# Patient Record
Sex: Male | Born: 1965
Health system: Southern US, Community
[De-identification: ages and names within clinical notes are randomized; demographics above are authoritative.]

## PROBLEM LIST (undated history)

## (undated) DIAGNOSIS — G8929 Other chronic pain: Secondary | ICD-10-CM

## (undated) DIAGNOSIS — F1120 Opioid dependence, uncomplicated: Secondary | ICD-10-CM

## (undated) DIAGNOSIS — Z720 Tobacco use: Secondary | ICD-10-CM

## (undated) DIAGNOSIS — M47816 Spondylosis without myelopathy or radiculopathy, lumbar region: Secondary | ICD-10-CM

## (undated) DIAGNOSIS — M549 Dorsalgia, unspecified: Secondary | ICD-10-CM

## (undated) DIAGNOSIS — F411 Generalized anxiety disorder: Secondary | ICD-10-CM

## (undated) HISTORY — DX: Opioid dependence, uncomplicated: F11.20

## (undated) HISTORY — DX: Spondylosis without myelopathy or radiculopathy, lumbar region: M47.816

## (undated) HISTORY — PX: BACK SURGERY: SHX140

## (undated) HISTORY — DX: Generalized anxiety disorder: F41.1

## (undated) HISTORY — DX: Tobacco use: Z72.0

---

## 2000-12-04 ENCOUNTER — Emergency Department (HOSPITAL_COMMUNITY): Admission: EM | Admit: 2000-12-04 | Discharge: 2000-12-04 | Payer: Self-pay | Admitting: Emergency Medicine

## 2000-12-04 ENCOUNTER — Encounter: Payer: Self-pay | Admitting: Emergency Medicine

## 2001-08-01 ENCOUNTER — Emergency Department (HOSPITAL_COMMUNITY): Admission: EM | Admit: 2001-08-01 | Discharge: 2001-08-01 | Payer: Self-pay

## 2002-03-09 ENCOUNTER — Emergency Department (HOSPITAL_COMMUNITY): Admission: EM | Admit: 2002-03-09 | Discharge: 2002-03-09 | Payer: Self-pay | Admitting: Emergency Medicine

## 2002-04-25 ENCOUNTER — Encounter: Payer: Self-pay | Admitting: Orthopedic Surgery

## 2002-04-25 ENCOUNTER — Encounter (INDEPENDENT_AMBULATORY_CARE_PROVIDER_SITE_OTHER): Payer: Self-pay | Admitting: Specialist

## 2002-04-25 ENCOUNTER — Observation Stay (HOSPITAL_COMMUNITY): Admission: RE | Admit: 2002-04-25 | Discharge: 2002-04-26 | Payer: Self-pay | Admitting: Orthopedic Surgery

## 2004-02-16 ENCOUNTER — Emergency Department (HOSPITAL_COMMUNITY): Admission: EM | Admit: 2004-02-16 | Discharge: 2004-02-16 | Payer: Self-pay | Admitting: *Deleted

## 2004-02-17 ENCOUNTER — Emergency Department (HOSPITAL_COMMUNITY): Admission: EM | Admit: 2004-02-17 | Discharge: 2004-02-17 | Payer: Self-pay | Admitting: Emergency Medicine

## 2004-03-06 ENCOUNTER — Ambulatory Visit (HOSPITAL_COMMUNITY): Admission: RE | Admit: 2004-03-06 | Discharge: 2004-03-06 | Payer: Self-pay | Admitting: Specialist

## 2004-06-21 ENCOUNTER — Emergency Department (HOSPITAL_COMMUNITY): Admission: EM | Admit: 2004-06-21 | Discharge: 2004-06-21 | Payer: Self-pay | Admitting: Family Medicine

## 2005-06-29 ENCOUNTER — Ambulatory Visit: Payer: Self-pay | Admitting: Internal Medicine

## 2005-07-13 ENCOUNTER — Emergency Department (HOSPITAL_COMMUNITY): Admission: EM | Admit: 2005-07-13 | Discharge: 2005-07-13 | Payer: Self-pay | Admitting: Emergency Medicine

## 2005-07-18 ENCOUNTER — Ambulatory Visit: Payer: Self-pay | Admitting: Internal Medicine

## 2005-12-27 ENCOUNTER — Ambulatory Visit: Payer: Self-pay | Admitting: Internal Medicine

## 2006-03-05 ENCOUNTER — Emergency Department (HOSPITAL_COMMUNITY): Admission: EM | Admit: 2006-03-05 | Discharge: 2006-03-05 | Payer: Self-pay | Admitting: Emergency Medicine

## 2006-03-08 ENCOUNTER — Ambulatory Visit: Payer: Self-pay | Admitting: Internal Medicine

## 2006-04-24 ENCOUNTER — Emergency Department (HOSPITAL_COMMUNITY): Admission: EM | Admit: 2006-04-24 | Discharge: 2006-04-24 | Payer: Self-pay | Admitting: Emergency Medicine

## 2006-07-25 DIAGNOSIS — M47816 Spondylosis without myelopathy or radiculopathy, lumbar region: Secondary | ICD-10-CM | POA: Insufficient documentation

## 2006-07-25 DIAGNOSIS — Z720 Tobacco use: Secondary | ICD-10-CM

## 2006-07-25 DIAGNOSIS — F411 Generalized anxiety disorder: Secondary | ICD-10-CM | POA: Insufficient documentation

## 2006-07-25 DIAGNOSIS — F329 Major depressive disorder, single episode, unspecified: Secondary | ICD-10-CM

## 2006-07-25 DIAGNOSIS — F1420 Cocaine dependence, uncomplicated: Secondary | ICD-10-CM | POA: Insufficient documentation

## 2006-07-25 DIAGNOSIS — M549 Dorsalgia, unspecified: Secondary | ICD-10-CM | POA: Insufficient documentation

## 2006-07-25 HISTORY — DX: Generalized anxiety disorder: F41.1

## 2006-07-25 HISTORY — DX: Spondylosis without myelopathy or radiculopathy, lumbar region: M47.816

## 2006-07-25 HISTORY — DX: Tobacco use: Z72.0

## 2006-08-25 DIAGNOSIS — M545 Low back pain: Secondary | ICD-10-CM

## 2006-11-11 ENCOUNTER — Ambulatory Visit (HOSPITAL_COMMUNITY): Admission: RE | Admit: 2006-11-11 | Discharge: 2006-11-11 | Payer: Self-pay | Admitting: Specialist

## 2006-12-01 ENCOUNTER — Encounter: Admission: RE | Admit: 2006-12-01 | Discharge: 2006-12-01 | Payer: Self-pay | Admitting: Specialist

## 2007-06-06 ENCOUNTER — Ambulatory Visit: Payer: Self-pay | Admitting: Internal Medicine

## 2007-06-06 LAB — CONVERTED CEMR LAB
ALT: 16 units/L (ref 0–53)
AST: 14 units/L (ref 0–37)
Albumin: 4.7 g/dL (ref 3.5–5.2)
Alkaline Phosphatase: 74 units/L (ref 39–117)
BUN: 12 mg/dL (ref 6–23)
Basophils Absolute: 0 10*3/uL (ref 0.0–0.1)
Basophils Relative: 0 % (ref 0–1)
CO2: 23 meq/L (ref 19–32)
Calcium: 9.8 mg/dL (ref 8.4–10.5)
Chloride: 106 meq/L (ref 96–112)
Creatinine, Ser: 0.95 mg/dL (ref 0.40–1.50)
Eosinophils Absolute: 0 10*3/uL (ref 0.0–0.7)
Eosinophils Relative: 0 % (ref 0–5)
Glucose, Bld: 91 mg/dL (ref 70–99)
HCT: 49.2 % (ref 39.0–52.0)
Hemoglobin: 16.7 g/dL (ref 13.0–17.0)
Lymphocytes Relative: 19 % (ref 12–46)
Lymphs Abs: 2.2 10*3/uL (ref 0.7–3.3)
MCHC: 33.9 g/dL (ref 30.0–36.0)
MCV: 90.3 fL (ref 78.0–100.0)
Monocytes Absolute: 0.5 10*3/uL (ref 0.2–0.7)
Monocytes Relative: 4 % (ref 3–11)
Neutro Abs: 8.9 10*3/uL — ABNORMAL HIGH (ref 1.7–7.7)
Neutrophils Relative %: 77 % (ref 43–77)
Platelets: 282 10*3/uL (ref 150–400)
Potassium: 4.5 meq/L (ref 3.5–5.3)
RBC: 5.45 M/uL (ref 4.22–5.81)
RDW: 13.5 % (ref 11.5–14.0)
Sed Rate: 11 mm/hr (ref 0–16)
Sodium: 140 meq/L (ref 135–145)
Total Bilirubin: 0.5 mg/dL (ref 0.3–1.2)
Total Protein: 8.1 g/dL (ref 6.0–8.3)
WBC: 11.6 10*3/uL — ABNORMAL HIGH (ref 4.0–10.5)

## 2007-08-27 ENCOUNTER — Emergency Department (HOSPITAL_COMMUNITY): Admission: EM | Admit: 2007-08-27 | Discharge: 2007-08-27 | Payer: Self-pay | Admitting: Emergency Medicine

## 2007-10-03 ENCOUNTER — Emergency Department (HOSPITAL_COMMUNITY): Admission: EM | Admit: 2007-10-03 | Discharge: 2007-10-03 | Payer: Self-pay | Admitting: Emergency Medicine

## 2008-01-04 ENCOUNTER — Emergency Department (HOSPITAL_COMMUNITY): Admission: EM | Admit: 2008-01-04 | Discharge: 2008-01-04 | Payer: Self-pay | Admitting: Emergency Medicine

## 2008-03-24 ENCOUNTER — Emergency Department (HOSPITAL_COMMUNITY): Admission: EM | Admit: 2008-03-24 | Discharge: 2008-03-24 | Payer: Self-pay | Admitting: Emergency Medicine

## 2008-05-04 ENCOUNTER — Emergency Department (HOSPITAL_COMMUNITY): Admission: EM | Admit: 2008-05-04 | Discharge: 2008-05-04 | Payer: Self-pay | Admitting: Emergency Medicine

## 2008-06-19 ENCOUNTER — Emergency Department (HOSPITAL_COMMUNITY): Admission: EM | Admit: 2008-06-19 | Discharge: 2008-06-19 | Payer: Self-pay | Admitting: Emergency Medicine

## 2008-08-04 ENCOUNTER — Emergency Department (HOSPITAL_COMMUNITY): Admission: EM | Admit: 2008-08-04 | Discharge: 2008-08-04 | Payer: Self-pay | Admitting: Emergency Medicine

## 2008-12-16 ENCOUNTER — Emergency Department (HOSPITAL_COMMUNITY): Admission: EM | Admit: 2008-12-16 | Discharge: 2008-12-16 | Payer: Self-pay | Admitting: Emergency Medicine

## 2009-03-23 ENCOUNTER — Emergency Department (HOSPITAL_COMMUNITY): Admission: EM | Admit: 2009-03-23 | Discharge: 2009-03-23 | Payer: Self-pay | Admitting: Emergency Medicine

## 2009-03-29 ENCOUNTER — Emergency Department (HOSPITAL_COMMUNITY): Admission: EM | Admit: 2009-03-29 | Discharge: 2009-03-29 | Payer: Self-pay | Admitting: Emergency Medicine

## 2009-03-30 ENCOUNTER — Emergency Department (HOSPITAL_COMMUNITY): Admission: EM | Admit: 2009-03-30 | Discharge: 2009-03-30 | Payer: Self-pay | Admitting: Family Medicine

## 2010-10-03 ENCOUNTER — Encounter: Payer: Self-pay | Admitting: Infectious Diseases

## 2010-12-04 ENCOUNTER — Emergency Department (HOSPITAL_COMMUNITY): Payer: Self-pay

## 2010-12-04 ENCOUNTER — Emergency Department (HOSPITAL_COMMUNITY)
Admission: EM | Admit: 2010-12-04 | Discharge: 2010-12-04 | Disposition: A | Discharge status: Home / Self Care | Payer: Self-pay | Attending: Emergency Medicine | Admitting: Emergency Medicine

## 2010-12-04 DIAGNOSIS — S5000XA Contusion of unspecified elbow, initial encounter: Secondary | ICD-10-CM | POA: Insufficient documentation

## 2010-12-04 DIAGNOSIS — IMO0002 Reserved for concepts with insufficient information to code with codable children: Secondary | ICD-10-CM | POA: Insufficient documentation

## 2010-12-04 DIAGNOSIS — Y9289 Other specified places as the place of occurrence of the external cause: Secondary | ICD-10-CM | POA: Insufficient documentation

## 2010-12-04 DIAGNOSIS — W11XXXA Fall on and from ladder, initial encounter: Secondary | ICD-10-CM | POA: Insufficient documentation

## 2010-12-04 DIAGNOSIS — M25529 Pain in unspecified elbow: Secondary | ICD-10-CM | POA: Insufficient documentation

## 2010-12-04 DIAGNOSIS — M25559 Pain in unspecified hip: Secondary | ICD-10-CM | POA: Insufficient documentation

## 2010-12-19 LAB — URINALYSIS, ROUTINE W REFLEX MICROSCOPIC
Bilirubin Urine: NEGATIVE
Glucose, UA: NEGATIVE mg/dL
Hgb urine dipstick: NEGATIVE
Ketones, ur: NEGATIVE mg/dL
Nitrite: NEGATIVE
Protein, ur: NEGATIVE mg/dL
Specific Gravity, Urine: >1.046 — ABNORMAL HIGH (ref 1.005–1.030)
Urobilinogen, UA: 0.2 mg/dL (ref 0.0–1.0)
pH: 5.5 (ref 5.0–8.0)

## 2010-12-19 LAB — POCT I-STAT, CHEM 8
Chloride: 107 mEq/L (ref 96–112)
HCT: 43 % (ref 39.0–52.0)
Hemoglobin: 14.6 g/dL (ref 13.0–17.0)
Potassium: 4 mEq/L (ref 3.5–5.1)
Sodium: 141 mEq/L (ref 135–145)

## 2010-12-19 LAB — ETHANOL: Alcohol, Ethyl (B): <5 mg/dL (ref 0–10)

## 2010-12-22 LAB — POCT I-STAT, CHEM 8
BUN: 4 mg/dL — ABNORMAL LOW (ref 6–23)
Creatinine, Ser: 1 mg/dL (ref 0.4–1.5)
Glucose, Bld: 111 mg/dL — ABNORMAL HIGH (ref 70–99)
Hemoglobin: 14.3 g/dL (ref 13.0–17.0)
Potassium: 3.6 mEq/L (ref 3.5–5.1)
Sodium: 140 mEq/L (ref 135–145)
TCO2: 29 mmol/L (ref 0–100)

## 2010-12-22 LAB — URINE MICROSCOPIC-ADD ON

## 2010-12-22 LAB — URINALYSIS, ROUTINE W REFLEX MICROSCOPIC
Nitrite: NEGATIVE
Protein, ur: NEGATIVE mg/dL
Specific Gravity, Urine: 1.03 (ref 1.005–1.030)
Urobilinogen, UA: 1 mg/dL (ref 0.0–1.0)
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
## 2011-02-20 ENCOUNTER — Emergency Department (HOSPITAL_COMMUNITY): Payer: Self-pay

## 2011-02-20 ENCOUNTER — Emergency Department (HOSPITAL_COMMUNITY)
Admission: EM | Admit: 2011-02-20 | Discharge: 2011-02-20 | Disposition: A | Discharge status: Home / Self Care | Payer: Self-pay | Attending: Emergency Medicine | Admitting: Emergency Medicine

## 2011-02-20 DIAGNOSIS — R1031 Right lower quadrant pain: Secondary | ICD-10-CM | POA: Insufficient documentation

## 2011-02-20 DIAGNOSIS — Z79899 Other long term (current) drug therapy: Secondary | ICD-10-CM | POA: Insufficient documentation

## 2011-02-20 LAB — COMPREHENSIVE METABOLIC PANEL
Albumin: 3.6 g/dL (ref 3.5–5.2)
Alkaline Phosphatase: 62 U/L (ref 39–117)
BUN: 9 mg/dL (ref 6–23)
Calcium: 9 mg/dL (ref 8.4–10.5)
Creatinine, Ser: 0.83 mg/dL (ref 0.4–1.5)
GFR calc Af Amer: >60 mL/min (ref >60)
Glucose, Bld: 91 mg/dL (ref 70–99)
Total Protein: 6.9 g/dL (ref 6.0–8.3)

## 2011-02-20 LAB — CBC
HCT: 41.6 % (ref 39.0–52.0)
Hemoglobin: 14.8 g/dL (ref 13.0–17.0)
MCH: 32 pg (ref 26.0–34.0)
MCHC: 35.6 g/dL (ref 30.0–36.0)
MCV: 89.8 fL (ref 78.0–100.0)
RDW: 12.9 % (ref 11.5–15.5)

## 2011-02-20 LAB — URINALYSIS, ROUTINE W REFLEX MICROSCOPIC
Bilirubin Urine: NEGATIVE
Leukocytes, UA: NEGATIVE
Nitrite: NEGATIVE
Specific Gravity, Urine: 1.018 (ref 1.005–1.030)
Urobilinogen, UA: 0.2 mg/dL (ref 0.0–1.0)
pH: 5.5 (ref 5.0–8.0)

## 2011-02-20 LAB — DIFFERENTIAL
Basophils Absolute: 0 10*3/uL (ref 0.0–0.1)
Eosinophils Relative: 2 % (ref 0–5)
Lymphocytes Relative: 40 % (ref 12–46)
Monocytes Absolute: 0.4 10*3/uL (ref 0.1–1.0)
Monocytes Relative: 6 % (ref 3–12)
Neutro Abs: 4.2 10*3/uL (ref 1.7–7.7)

## 2011-02-20 LAB — LIPASE, BLOOD: Lipase: 19 U/L (ref 11–59)

## 2011-02-20 MED ORDER — IOHEXOL 300 MG/ML  SOLN: 100.0000 mL | Freq: Once | INTRAMUSCULAR | Status: DC | PRN

## 2011-02-27 ENCOUNTER — Inpatient Hospital Stay (INDEPENDENT_AMBULATORY_CARE_PROVIDER_SITE_OTHER)
Admission: RE | Admit: 2011-02-27 | Discharge: 2011-02-27 | Disposition: A | Discharge status: Home / Self Care | Payer: Self-pay | Source: Ambulatory Visit | Attending: Family Medicine | Admitting: Family Medicine

## 2011-02-27 DIAGNOSIS — K089 Disorder of teeth and supporting structures, unspecified: Secondary | ICD-10-CM

## 2011-04-20 ENCOUNTER — Emergency Department (HOSPITAL_COMMUNITY)
Admission: EM | Admit: 2011-04-20 | Discharge: 2011-04-21 | Disposition: A | Discharge status: Home / Self Care | Payer: Self-pay | Attending: Emergency Medicine | Admitting: Emergency Medicine

## 2011-04-20 ENCOUNTER — Emergency Department (HOSPITAL_COMMUNITY): Payer: Self-pay

## 2011-04-20 DIAGNOSIS — M545 Low back pain, unspecified: Secondary | ICD-10-CM | POA: Insufficient documentation

## 2011-04-20 DIAGNOSIS — W208XXA Other cause of strike by thrown, projected or falling object, initial encounter: Secondary | ICD-10-CM | POA: Insufficient documentation

## 2011-04-20 DIAGNOSIS — M79609 Pain in unspecified limb: Secondary | ICD-10-CM | POA: Insufficient documentation

## 2011-04-20 DIAGNOSIS — G8929 Other chronic pain: Secondary | ICD-10-CM | POA: Insufficient documentation

## 2011-06-11 ENCOUNTER — Emergency Department (HOSPITAL_COMMUNITY): Payer: Self-pay

## 2011-06-11 ENCOUNTER — Emergency Department (HOSPITAL_COMMUNITY)
Admission: EM | Admit: 2011-06-11 | Discharge: 2011-06-11 | Disposition: A | Discharge status: Home / Self Care | Payer: Self-pay | Attending: Emergency Medicine | Admitting: Emergency Medicine

## 2011-06-11 DIAGNOSIS — R209 Unspecified disturbances of skin sensation: Secondary | ICD-10-CM | POA: Insufficient documentation

## 2011-06-11 DIAGNOSIS — M545 Low back pain, unspecified: Secondary | ICD-10-CM | POA: Insufficient documentation

## 2011-06-11 DIAGNOSIS — G8929 Other chronic pain: Secondary | ICD-10-CM | POA: Insufficient documentation

## 2011-06-11 DIAGNOSIS — M79609 Pain in unspecified limb: Secondary | ICD-10-CM | POA: Insufficient documentation

## 2011-07-11 ENCOUNTER — Emergency Department (HOSPITAL_COMMUNITY)
Admission: EM | Admit: 2011-07-11 | Discharge: 2011-07-11 | Disposition: A | Discharge status: Home / Self Care | Payer: Self-pay | Attending: Emergency Medicine | Admitting: Emergency Medicine

## 2011-07-11 ENCOUNTER — Emergency Department (HOSPITAL_COMMUNITY): Payer: Self-pay

## 2011-07-11 DIAGNOSIS — S61209A Unspecified open wound of unspecified finger without damage to nail, initial encounter: Secondary | ICD-10-CM | POA: Insufficient documentation

## 2011-07-11 DIAGNOSIS — M79609 Pain in unspecified limb: Secondary | ICD-10-CM | POA: Insufficient documentation

## 2011-12-18 ENCOUNTER — Emergency Department (HOSPITAL_COMMUNITY): Payer: Self-pay

## 2011-12-18 ENCOUNTER — Encounter (HOSPITAL_COMMUNITY): Payer: Self-pay | Admitting: Emergency Medicine

## 2011-12-18 ENCOUNTER — Emergency Department (HOSPITAL_COMMUNITY)
Admission: EM | Admit: 2011-12-18 | Discharge: 2011-12-19 | Disposition: A | Discharge status: Home / Self Care | Payer: Self-pay | Attending: Emergency Medicine | Admitting: Emergency Medicine

## 2011-12-18 DIAGNOSIS — M545 Low back pain, unspecified: Secondary | ICD-10-CM | POA: Insufficient documentation

## 2011-12-18 DIAGNOSIS — F17200 Nicotine dependence, unspecified, uncomplicated: Secondary | ICD-10-CM | POA: Insufficient documentation

## 2011-12-18 DIAGNOSIS — M79609 Pain in unspecified limb: Secondary | ICD-10-CM | POA: Insufficient documentation

## 2011-12-18 DIAGNOSIS — Z9889 Other specified postprocedural states: Secondary | ICD-10-CM | POA: Insufficient documentation

## 2011-12-18 DIAGNOSIS — M546 Pain in thoracic spine: Secondary | ICD-10-CM | POA: Insufficient documentation

## 2011-12-18 DIAGNOSIS — R209 Unspecified disturbances of skin sensation: Secondary | ICD-10-CM | POA: Insufficient documentation

## 2011-12-18 DIAGNOSIS — R269 Unspecified abnormalities of gait and mobility: Secondary | ICD-10-CM | POA: Insufficient documentation

## 2011-12-18 DIAGNOSIS — M549 Dorsalgia, unspecified: Secondary | ICD-10-CM

## 2011-12-18 DIAGNOSIS — Z79899 Other long term (current) drug therapy: Secondary | ICD-10-CM | POA: Insufficient documentation

## 2011-12-18 HISTORY — DX: Other chronic pain: G89.29

## 2011-12-18 HISTORY — DX: Dorsalgia, unspecified: M54.9

## 2011-12-18 MED ORDER — HYDROMORPHONE HCL PF 1 MG/ML IJ SOLN
1.0000 mg | Freq: Once | INTRAMUSCULAR | Status: AC
  Administered 2011-12-18: 1 mg via INTRAMUSCULAR
  Filled 2011-12-18: qty 1
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
## 2011-12-19 MED ORDER — HYDROMORPHONE HCL PF 1 MG/ML IJ SOLN
1.0000 mg | Freq: Once | INTRAMUSCULAR | Status: AC
  Administered 2011-12-19: 1 mg via INTRAMUSCULAR
  Filled 2011-12-19: qty 1

## 2011-12-19 NOTE — ED Notes (Signed)
D/c instructions reviewed w/ pt - pt denies any further questions or concerns at present.   

## 2011-12-19 NOTE — Discharge Instructions (Signed)
Followup with orthopedics tomorrow as scheduled. Use conservative methods at home including heat therapy and cold therapy as we discussed. More information on cold therapy is listed below.  It is not reccommended to use heat treatment directly after an acute injury.  SEEK IMMEDIATE MEDICAL ATTENTION IF: New numbness, tingling, weakness, or problem with the use of your arms or legs.  Severe back pain not relieved with medications.  Change in bowel or bladder control.  Increasing pain in any areas of the body (such as chest or abdominal pain).  Shortness of breath, dizziness or fainting.  Nausea (feeling sick to your stomach), vomiting, fever, or sweats.  COLD THERAPY DIRECTIONS:  Ice or gel packs can be used to reduce both pain and swelling. Ice is the most helpful within the first 24 to 48 hours after an injury or flareup from overusing a muscle or joint.  Ice is effective, has very few side effects, and is safe for most people to use.   If you expose your skin to cold temperatures for too long or without the proper protection, you can damage your skin or nerves. Watch for signs of skin damage due to cold.   HOME CARE INSTRUCTIONS  Follow these tips to use ice and cold packs safely.  Place a dry or damp towel between the ice and skin. A damp towel will cool the skin more quickly, so you may need to shorten the time that the ice is used.  For a more rapid response, add gentle compression to the ice.  Ice for no more than 10 to 20 minutes at a time. The bonier the area you are icing, the less time it will take to get the benefits of ice.  Check your skin after 5 minutes to make sure there are no signs of a poor response to cold or skin damage.  Rest 20 minutes or more in between uses.  Once your skin is numb, you can end your treatment. You can test numbness by very lightly touching your skin. The touch should be so light that you do not see the skin dimple from the pressure of your fingertip.  When using ice, most people will feel these normal sensations in this order: cold, burning, aching, and numbness.  Do not use ice on someone who cannot communicate their responses to pain, such as small children or people with dementia.   HOW TO MAKE AN ICE PACK  To make an ice pack, do one of the following:  Place crushed ice or a bag of frozen vegetables in a sealable plastic bag. Squeeze out the excess air. Place this bag inside another plastic bag. Slide the bag into a pillowcase or place a damp towel between your skin and the bag.  Mix 3 parts water with 1 part rubbing alcohol. Freeze the mixture in a sealable plastic bag. When you remove the mixture from the freezer, it will be slushy. Squeeze out the excess air. Place this bag inside another plastic bag. Slide the bag into a pillowcase or place a damp towel between your skin and the bag.   SEEK MEDICAL CARE IF:  You develop white spots on your skin. This may give the skin a blotchy (mottled) appearance.  Your skin turns blue or pale.  Your skin becomes waxy or hard.  Your swelling gets worse.  MAKE SURE YOU:  Understand these instructions.  Will watch your condition.  Will get help right away if you are not doing well or  get worse.    Chronic Pain Discharge Instructions  Emergency care providers appreciate that many patients coming to [place] are in severe pain and we wish to address their pain in the safest, most responsible manner.  It is important to recognize however, that the proper treatment of chronic pain differs from that of the pain of injuries and acute illnesses.  Our goal is to provide quality, safe, personalized care and we thank you for giving [place] the opportunity to serve you. The use of narcotics and related agents for chronic pain syndromes may lead to additional physical and psychological problems.  Nearly as many people die from prescription narcotics each year as die from car crashes.  Additionally, this risk is increased if  such prescriptions are obtained from a variety of sources.  Therefore, only your primary care physician or a pain management specialist is able to safely treat such syndromes with narcotic medications long-term.    Documentation revealing such prescriptions have been sought from multiple sources may prohibit [place] from providing a refill or different narcotic medication.  Your name may be checked first through the [facility] Controlled Substances Reporting System.  This database is a record of controlled substance medication prescriptions that the patient has received.  This has been established by [facility] in an effort to eliminate the dangerous, and often life threatening, practice of obtaining multiple prescriptions from different medical providers.   If you have a chronic pain syndrome (i.e. chronic headaches, recurrent back or neck pain, dental pain, abdominal or pelvis pain without a specific diagnosis, or neuropathic pain such as fibromyalgia) or recurrent visits for the same condition without an acute diagnosis, you may be treated with non-narcotics and other non-addictive medicines.  Allergic reactions or negative side effects that may be reported by a patient to such medications will not typically lead to the use of a narcotic analgesic or other controlled substance as an alternative.   Patients managing chronic pain with a personal physician should have provisions in place for breakthrough pain.  If you are in crisis, you should call your physician.  If your physician directs you to the emergency department, please have the doctor call and speak to our attending physician concerning your care.   When patients come to the Emergency Department (ED) with acute medical conditions in which the Emergency Department physician feels appropriate to prescribe narcotic or sedating pain medication, the physician will prescribe these in very limited quantities.  The amount of these medications will last  only until you can see your primary care physician in his/her office.  Any patient who returns to the ED seeking refills should expect only non-narcotic pain medications.   In the event of an acute medical condition exists and the emergency physician feels it is necessary that the patient be given a narcotic or sedating medication -  a responsible adult driver should be present in the room prior to the medication being given by the nurse.   Prescriptions for narcotic or sedating medications that have been lost, stolen or expired will not be refilled in the Emergency Department.    Patients who have chronic pain may receive non-narcotic prescriptions until seen by their primary care physician.  It is every patient's personal responsibility to maintain active prescriptions with his or her primary care physician or specialist.
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
## 2011-12-21 NOTE — ED Provider Notes (Signed)
Medical screening examination/treatment/procedure(s) were performed by non-physician practitioner and as supervising physician I was immediately available for consultation/collaboration.   Daisy Mcneel, MD 12/21/11 1034 

## 2012-05-19 ENCOUNTER — Encounter (HOSPITAL_COMMUNITY): Payer: Self-pay | Admitting: Physical Medicine and Rehabilitation

## 2012-05-19 ENCOUNTER — Emergency Department (HOSPITAL_COMMUNITY)
Admission: EM | Admit: 2012-05-19 | Discharge: 2012-05-19 | Disposition: A | Discharge status: Home / Self Care | Payer: Self-pay | Attending: Emergency Medicine | Admitting: Emergency Medicine

## 2012-05-19 DIAGNOSIS — K0889 Other specified disorders of teeth and supporting structures: Secondary | ICD-10-CM

## 2012-05-19 DIAGNOSIS — F17200 Nicotine dependence, unspecified, uncomplicated: Secondary | ICD-10-CM | POA: Insufficient documentation

## 2012-05-19 DIAGNOSIS — G8929 Other chronic pain: Secondary | ICD-10-CM | POA: Insufficient documentation

## 2012-05-19 DIAGNOSIS — K029 Dental caries, unspecified: Secondary | ICD-10-CM | POA: Insufficient documentation

## 2012-05-19 DIAGNOSIS — M549 Dorsalgia, unspecified: Secondary | ICD-10-CM | POA: Insufficient documentation

## 2012-05-19 MED ORDER — PENICILLIN V POTASSIUM 500 MG PO TABS: 500.0000 mg | ORAL_TABLET | Freq: Three times a day (TID) | ORAL | Status: AC

## 2012-05-19 NOTE — ED Notes (Signed)
Pt presents to department for evaluation of R lower molar pain. Ongoing for several days. R sided facial swelling noted. 4/10 pain at the time. Pt does not have dentist.

## 2012-05-19 NOTE — ED Provider Notes (Signed)
History   This chart was scribed for Omar Lyons, MD by Toya Smothers. The patient was seen in room TR10C/TR10C. Patient's care was started at 1726.  CSN: [id number]  Arrival date & time 05/19/12  1726   First MD Initiated Contact with Patient 05/19/12 1824      Chief Complaint  Patient presents with  . Dental Pain   Patient is a 46 y.o. male presenting with tooth pain. The history is provided by the patient. No language interpreter was used.  Dental PainThe primary symptoms include sore throat. Primary symptoms do not include fever, shortness of breath or cough.    Omar Castillo is a 46 y.o. male who presents to the Emergency Department complaining of 10 days of moderate right lower moral pain. Pt reports that his crown fell out 7 days ago. Pain as increased acutely and is described as a moderate throbbing soreness. Pt also denotes  trouble swallowing described as a soreness. Prior to arrival Pt has treated symptoms with oxycodone with mild relief. Pt denies fever, chills, emesis, nausea, rash, and cough.   Past Medical History  Diagnosis Date  . Back pain, chronic     Past Surgical History  Procedure Date  . Back surgery     History reviewed. No pertinent family history.  History  Substance Use Topics  . Smoking status: Current Everyday Smoker  . Smokeless tobacco: Not on file  . Alcohol Use: No      Review of Systems  Constitutional: Negative for fever and chills.  HENT: Positive for sore throat and dental problem. Negative for rhinorrhea and neck pain.   Eyes: Negative for pain.  Respiratory: Negative for cough and shortness of breath.   Cardiovascular: Negative for chest pain.  Gastrointestinal: Negative for nausea, vomiting, abdominal pain and diarrhea.  Genitourinary: Negative for dysuria.  Musculoskeletal: Negative for back pain.  Skin: Negative for rash.  Neurological: Negative for dizziness and weakness.  All other systems reviewed and are  negative.    Allergies  Review of patient's allergies indicates no known allergies.  Home Medications   Current Outpatient Rx  Name Route Sig Dispense Refill  . ALPRAZOLAM 1 MG PO TABS Oral Take 1 mg by mouth 3 (three) times daily as needed. For anxiety    . OXYCODONE HCL 30 MG PO TABS Oral Take 30 mg by mouth 6 (six) times daily. For breakthrough pain    . OXYMORPHONE HCL ER 40 MG PO TB12 Oral Take 40 mg by mouth every 12 (twelve) hours.      BP 120/77  Pulse 80  Temp 98.9 F (37.2 C) (Oral)  Resp 20  SpO2 95%  Physical Exam  Nursing note and vitals reviewed. Constitutional: He appears well-developed and well-nourished. No distress.  HENT:  Head: Normocephalic and atraumatic.  Right Ear: External ear normal.  Left Ear: External ear normal.       Right lower 1st molar heavily decayed and cracked at gumline. Surrounding area of erythema.  Eyes: Conjunctivae are normal. Right eye exhibits no discharge. Left eye exhibits no discharge. No scleral icterus.  Neck: Neck supple. No tracheal deviation present.  Cardiovascular: Normal rate.   Pulmonary/Chest: Effort normal. No stridor. No respiratory distress.  Musculoskeletal: He exhibits no edema.  Neurological: He is alert. Cranial nerve deficit: no gross deficits.  Skin: Skin is warm and dry. No rash noted.  Psychiatric: He has a normal mood and affect.    ED Course  Procedures (including critical care time)  DIAGNOSTIC STUDIES: Oxygen Saturation is 95% on room air, adequate by my interpretation.    COORDINATION OF CARE: 18:25- Evaluated Pt. Pt is awake, alert and oriented.   Labs Reviewed - No data to display No results found.   No diagnosis found.    MDM  Will treat with antibiotics, follow up with dentistry in the next week.  He already has sufficient pain meds at home.        I personally performed the services described in this documentation, which was scribed in my presence. The recorded information has  been reviewed and considered.      Omar Lyons, MD 05/21/12 [phone]

## 2012-11-10 ENCOUNTER — Emergency Department (HOSPITAL_COMMUNITY): Payer: BC Managed Care – PPO

## 2012-11-10 ENCOUNTER — Encounter (HOSPITAL_COMMUNITY): Payer: Self-pay | Admitting: Physical Medicine and Rehabilitation

## 2012-11-10 ENCOUNTER — Emergency Department (HOSPITAL_COMMUNITY)
Admission: EM | Admit: 2012-11-10 | Discharge: 2012-11-10 | Disposition: A | Discharge status: Home / Self Care | Payer: BC Managed Care – PPO | Attending: Emergency Medicine | Admitting: Emergency Medicine

## 2012-11-10 DIAGNOSIS — Y9289 Other specified places as the place of occurrence of the external cause: Secondary | ICD-10-CM | POA: Insufficient documentation

## 2012-11-10 DIAGNOSIS — G8929 Other chronic pain: Secondary | ICD-10-CM | POA: Insufficient documentation

## 2012-11-10 DIAGNOSIS — Y9301 Activity, walking, marching and hiking: Secondary | ICD-10-CM | POA: Insufficient documentation

## 2012-11-10 DIAGNOSIS — Z9889 Other specified postprocedural states: Secondary | ICD-10-CM | POA: Insufficient documentation

## 2012-11-10 DIAGNOSIS — W108XXA Fall (on) (from) other stairs and steps, initial encounter: Secondary | ICD-10-CM | POA: Insufficient documentation

## 2012-11-10 DIAGNOSIS — IMO0002 Reserved for concepts with insufficient information to code with codable children: Secondary | ICD-10-CM | POA: Insufficient documentation

## 2012-11-10 DIAGNOSIS — S3992XS Unspecified injury of lower back, sequela: Secondary | ICD-10-CM

## 2012-11-10 DIAGNOSIS — Z79899 Other long term (current) drug therapy: Secondary | ICD-10-CM | POA: Insufficient documentation

## 2012-11-10 DIAGNOSIS — S8990XA Unspecified injury of unspecified lower leg, initial encounter: Secondary | ICD-10-CM | POA: Insufficient documentation

## 2012-11-10 DIAGNOSIS — F17200 Nicotine dependence, unspecified, uncomplicated: Secondary | ICD-10-CM | POA: Insufficient documentation

## 2012-11-10 DIAGNOSIS — S3660XA Unspecified injury of rectum, initial encounter: Secondary | ICD-10-CM | POA: Insufficient documentation

## 2012-11-10 DIAGNOSIS — R209 Unspecified disturbances of skin sensation: Secondary | ICD-10-CM | POA: Insufficient documentation

## 2012-11-10 MED ORDER — HYDROMORPHONE HCL PF 1 MG/ML IJ SOLN
1.0000 mg | Freq: Once | INTRAMUSCULAR | Status: AC
  Administered 2012-11-10: 1 mg via INTRAMUSCULAR
  Filled 2012-11-10: qty 1

## 2012-11-10 NOTE — ED Notes (Signed)
Pt presents to department for evaluation of back pain. States chronic issues with back pain. States he feel 2 weeks ago on ice, then fell again yesterday after L leg "gave way." now states pain to bilateral hips/legs and lower back. 10/10 upon arrival, becomes worse with movement. Takes 30mg  oxycodone at home, no relief. Pt is alert and oriented x4.

## 2012-11-10 NOTE — ED Provider Notes (Signed)
History    This chart was scribed for non-physician practitioner working with Juliet Rude. Rubin Payor, MD by Frederik Pear, ED Scribe. This patient was seen in room TR05C/TR05C and the patient's care was started at 1627.   CSN: [id number]  Arrival date & time 11/10/12  1617   First MD Initiated Contact with Patient 11/10/12 1627      Chief Complaint  Patient presents with  . Back Pain    (Consider location/radiation/quality/duration/timing/severity/associated sxs/prior treatment) The history is provided by the patient. No language interpreter was used.   Omar Castillo is a 47 y.o. male with a h/o of chronic back pain, spinal stenosis, and 3 prior back surgeries who presents to the Emergency Department complaining of worse than baseline, constant, sudden onset, 10/10, lower back pain that radiates to his bilateral hips into his legs that is aggravated by movement that  worsened yesterday after his left leg gave out while walking down a flight of stairs causing him to fall on his tailbone. He also complains of bilateral ankle pain and tailbone numbness, and rectal pain that also began yesterday after the fall. He denies any fecal or urinary incontinence.  Denies saddle paresthesia. He denies any h/o of similar instances where his leg has given out. He also reports that he fell two weeks after slipping on ice, which reaggravated his chronic pain. He reports that he has been treating the symptoms at home with 30 mg of Roxicodone every 6 hours and Advil without relief.  PCP is Dr. Dell Ponto. Surgeries performed by [facility].   Past Medical History  Diagnosis Date  . Back pain, chronic     Past Surgical History  Procedure Laterality Date  . Back surgery      History reviewed. No pertinent family history.  History  Substance Use Topics  . Smoking status: Current Every Day Smoker    Types: Cigarettes  . Smokeless tobacco: Not on file  . Alcohol Use: No      Review of  Systems  Musculoskeletal: Positive for back pain.  All other systems reviewed and are negative.    Allergies  Review of patient's allergies indicates no known allergies.  Home Medications   Current Outpatient Rx  Name  Route  Sig  Dispense  Refill  . ALPRAZolam (XANAX) 1 MG tablet   Oral   Take 1 mg by mouth 3 (three) times daily as needed. For anxiety         . oxycodone (ROXICODONE) 30 MG immediate release tablet   Oral   Take 30 mg by mouth 6 (six) times daily. For breakthrough pain         . oxymorphone (OPANA ER) 40 MG 12 hr tablet   Oral   Take 40 mg by mouth every 12 (twelve) hours.           BP 138/78  Pulse 112  Temp(Src) 99.4 F (37.4 C) (Oral)  SpO2 97%  Physical Exam  Nursing note and vitals reviewed. Constitutional: He is oriented to person, place, and time. He appears well-developed and well-nourished. No distress.  HENT:  Head: Normocephalic and atraumatic.  Eyes: EOM are normal. Pupils are equal, round, and reactive to light.  Neck: Normal range of motion. Neck supple. No tracheal deviation present.  Cardiovascular: Normal rate.   Pulmonary/Chest: Effort normal. No respiratory distress.  Abdominal: Soft. He exhibits no distension.  Genitourinary: Rectal exam shows anal tone normal.  Musculoskeletal: Normal range of motion. He exhibits tenderness. He  exhibits no edema.  Normal patellar reflexes. Tenderness to midline L-spine without step off or crepitus. There is a well-healed lumbar midline scar without evidence of infection. Decreased hip flexion ROM that is secondary to pain bilaterally. 4/5 strength in bilateral lower extremities that is secondary to pain.  Able to ambulate with a mild antalgic gait that was favoring the left side more than the right. No foot drop.   Neurological: He is alert and oriented to person, place, and time.  Skin: Skin is warm and dry.  Psychiatric: He has a normal mood and affect. His behavior is normal.    ED  Course  Procedures (including critical care time)  DIAGNOSTIC STUDIES: Oxygen Saturation is 97% on room air, adequate by my interpretation.    COORDINATION OF CARE:  16:37- Discussed planned course of treatment with the patient, including a lumbar and sacral spine X-ray and Dilaudid, who is agreeable at this time.  16:43- Medication Orders- hydromorphone (dilaudid) injection 1 mg- once.   18:03- Recheck- Discussed negative X-ray findings. He has no new neurovascular deficiencies. He is requesting more pain medication, which will be given in the ED. In response to a request for a prescription of pain medication, he should follow up with his PCP or orthopedist for long term pain management.   18:15- Medication Orders- hydromorphone (dilaudid) injection 1 mg- once.   Labs Reviewed - No data to display Dg Lumbar Spine Complete  11/10/2012  *RADIOLOGY REPORT*  Clinical Data: Fall, low back pain.  LUMBAR SPINE - COMPLETE 4+ VIEW  Comparison: 12/18/2011  Findings: Moderate L4-5 degenerative disc disease with disc space narrowing and spurring.  Moderate facet disease from L3-4 through L5-S1.  Loss of normal lumbar lordosis.  No fracture or subluxation.  SI joints are symmetric and unremarkable.  IMPRESSION: Degenerative changes as above.  Findings similar prior study.  No acute bony abnormality.   Original Report Authenticated By: Charlett Nose, M.D.    Dg Sacrum/coccyx  11/10/2012  *RADIOLOGY REPORT*  Clinical Data: Back pain.  SACRUM AND COCCYX - 2+ VIEW  Comparison: CT 02/20/2011  Findings: SI joints and visualized hip joints are symmetric and unremarkable.  No acute bony abnormality within the visualized pelvis.  No sacral or coccygeal fracture.  IMPRESSION: No acute bony abnormality.   Original Report Authenticated By: Charlett Nose, M.D.      1. LOW BACK PAIN   2. Lower back injury, sequela       MDM  I personally performed the services described in this documentation, which was scribed in my  presence. The recorded information has been reviewed and is accurate.         Fayrene Helper, PA-C 11/10/12 1808

## 2012-11-11 NOTE — ED Provider Notes (Signed)
Medical screening examination/treatment/procedure(s) were performed by non-physician practitioner and as supervising physician I was immediately available for consultation/collaboration.  Askari Kinley R. Weyman Bogdon, MD 11/11/12 0010 

## 2013-09-24 ENCOUNTER — Encounter (HOSPITAL_COMMUNITY): Payer: Self-pay | Admitting: Emergency Medicine

## 2013-09-24 ENCOUNTER — Emergency Department (HOSPITAL_COMMUNITY)
Admission: EM | Admit: 2013-09-24 | Discharge: 2013-09-24 | Disposition: A | Discharge status: Home / Self Care | Payer: BC Managed Care – PPO | Attending: Emergency Medicine | Admitting: Emergency Medicine

## 2013-09-24 ENCOUNTER — Emergency Department (HOSPITAL_COMMUNITY): Payer: 59

## 2013-09-24 DIAGNOSIS — F17200 Nicotine dependence, unspecified, uncomplicated: Secondary | ICD-10-CM | POA: Insufficient documentation

## 2013-09-24 DIAGNOSIS — R197 Diarrhea, unspecified: Secondary | ICD-10-CM | POA: Insufficient documentation

## 2013-09-24 DIAGNOSIS — R110 Nausea: Secondary | ICD-10-CM | POA: Insufficient documentation

## 2013-09-24 DIAGNOSIS — Z79899 Other long term (current) drug therapy: Secondary | ICD-10-CM | POA: Insufficient documentation

## 2013-09-24 DIAGNOSIS — D72829 Elevated white blood cell count, unspecified: Secondary | ICD-10-CM | POA: Insufficient documentation

## 2013-09-24 DIAGNOSIS — G8929 Other chronic pain: Secondary | ICD-10-CM | POA: Insufficient documentation

## 2013-09-24 DIAGNOSIS — M549 Dorsalgia, unspecified: Secondary | ICD-10-CM | POA: Insufficient documentation

## 2013-09-24 DIAGNOSIS — R109 Unspecified abdominal pain: Secondary | ICD-10-CM | POA: Insufficient documentation

## 2013-09-24 LAB — URINALYSIS, ROUTINE W REFLEX MICROSCOPIC
Bilirubin Urine: NEGATIVE
Glucose, UA: NEGATIVE mg/dL
Hgb urine dipstick: NEGATIVE
Ketones, ur: NEGATIVE mg/dL
Leukocytes, UA: NEGATIVE
Nitrite: NEGATIVE
Protein, ur: NEGATIVE mg/dL
Specific Gravity, Urine: 1.015 (ref 1.005–1.030)
Urobilinogen, UA: 0.2 mg/dL (ref 0.0–1.0)
pH: 6.5 (ref 5.0–8.0)

## 2013-09-24 LAB — CBC WITH DIFFERENTIAL/PLATELET
Basophils Absolute: 0 10*3/uL (ref 0.0–0.1)
Basophils Relative: 0 % (ref 0–1)
Eosinophils Absolute: 0.1 10*3/uL (ref 0.0–0.7)
Eosinophils Relative: 1 % (ref 0–5)
HCT: 43.1 % (ref 39.0–52.0)
Hemoglobin: 15.7 g/dL (ref 13.0–17.0)
Lymphocytes Relative: 23 % (ref 12–46)
Lymphs Abs: 2.6 10*3/uL (ref 0.7–4.0)
MCH: 32 pg (ref 26.0–34.0)
MCHC: 36.4 g/dL — ABNORMAL HIGH (ref 30.0–36.0)
MCV: 87.8 fL (ref 78.0–100.0)
Monocytes Absolute: 0.4 10*3/uL (ref 0.1–1.0)
Monocytes Relative: 4 % (ref 3–12)
Neutro Abs: 7.9 10*3/uL — ABNORMAL HIGH (ref 1.7–7.7)
Neutrophils Relative %: 72 % (ref 43–77)
Platelets: 223 10*3/uL (ref 150–400)
RBC: 4.91 MIL/uL (ref 4.22–5.81)
RDW: 13.2 % (ref 11.5–15.5)
WBC: 11 10*3/uL — ABNORMAL HIGH (ref 4.0–10.5)

## 2013-09-24 LAB — COMPREHENSIVE METABOLIC PANEL
ALT: 14 U/L (ref 0–53)
AST: 48 U/L — ABNORMAL HIGH (ref 0–37)
Albumin: 3.8 g/dL (ref 3.5–5.2)
Alkaline Phosphatase: 51 U/L (ref 39–117)
BUN: 8 mg/dL (ref 6–23)
CO2: 23 mEq/L (ref 19–32)
Calcium: 8.9 mg/dL (ref 8.4–10.5)
Chloride: 103 mEq/L (ref 96–112)
Creatinine, Ser: 0.7 mg/dL (ref 0.50–1.35)
GFR calc Af Amer: >90 mL/min (ref >90)
GFR calc non Af Amer: >90 mL/min (ref >90)
Glucose, Bld: 118 mg/dL — ABNORMAL HIGH (ref 70–99)
Potassium: 5.7 mEq/L — ABNORMAL HIGH (ref 3.7–5.3)
Sodium: 139 mEq/L (ref 137–147)
Total Bilirubin: 0.5 mg/dL (ref 0.3–1.2)
Total Protein: 7.9 g/dL (ref 6.0–8.3)

## 2013-09-24 LAB — LIPASE, BLOOD: Lipase: 22 U/L (ref 11–59)

## 2013-09-24 MED ORDER — KETOROLAC TROMETHAMINE 30 MG/ML IJ SOLN
30.0000 mg | Freq: Once | INTRAMUSCULAR | Status: AC
  Administered 2013-09-24: 30 mg via INTRAVENOUS
  Filled 2013-09-24: qty 1

## 2013-09-24 MED ORDER — HYDROMORPHONE HCL PF 1 MG/ML IJ SOLN
1.0000 mg | Freq: Once | INTRAMUSCULAR | Status: AC
  Administered 2013-09-24: 1 mg via INTRAVENOUS
  Filled 2013-09-24: qty 1

## 2013-09-24 MED ORDER — ONDANSETRON HCL 4 MG/2ML IJ SOLN
4.0000 mg | Freq: Once | INTRAMUSCULAR | Status: AC
  Administered 2013-09-24: 4 mg via INTRAVENOUS
  Filled 2013-09-24: qty 2

## 2013-09-24 MED ORDER — HYDROMORPHONE HCL PF 1 MG/ML IJ SOLN
2.0000 mg | Freq: Once | INTRAMUSCULAR | Status: AC
  Administered 2013-09-24: 2 mg via INTRAVENOUS
  Filled 2013-09-24: qty 2

## 2013-09-24 MED ORDER — LORAZEPAM 2 MG/ML IJ SOLN
1.0000 mg | Freq: Once | INTRAMUSCULAR | Status: AC
  Administered 2013-09-24: 1 mg via INTRAVENOUS
  Filled 2013-09-24: qty 1

## 2013-09-24 MED ORDER — HYDROMORPHONE HCL PF 1 MG/ML IJ SOLN
INTRAMUSCULAR | Status: AC
  Filled 2013-09-24: qty 1

## 2013-09-24 MED ORDER — IOHEXOL 300 MG/ML  SOLN
100.0000 mL | Freq: Once | INTRAMUSCULAR | Status: AC | PRN
  Administered 2013-09-24: 100 mL via INTRAVENOUS

## 2013-09-24 MED ORDER — IOHEXOL 300 MG/ML  SOLN
25.0000 mL | INTRAMUSCULAR | Status: AC
  Administered 2013-09-24: 25 mL via ORAL

## 2013-09-24 MED ORDER — SODIUM CHLORIDE 0.9 % IV BOLUS (SEPSIS)
1000.0000 mL | Freq: Once | INTRAVENOUS | Status: AC
  Administered 2013-09-24: 1000 mL via INTRAVENOUS

## 2013-09-24 NOTE — ED Notes (Signed)
Patient currently waiting med time before discharge.  

## 2013-09-24 NOTE — ED Provider Notes (Signed)
CSN: [id number]     Arrival date & time 09/24/13  1418 History   First MD Initiated Contact with Patient 09/24/13 1540     Chief Complaint  Patient presents with  . Abdominal Pain   (Consider location/radiation/quality/duration/timing/severity/associated sxs/prior Treatment) HPI  47 rolled male with abdominal pain. Gradual onset yesterday. Pain is across lower abdomen. Does not particularly localize to either side. Initially intermittent but now is constant. No fevers or chills. Nausea, but no vomiting. No urinary complaints. Diarrhea. Has not noticed any blood in the stool. No fevers or chills. Patient has been taking oxycodone which she has her chronic back pain. Is constantly helped his abdominal pain. No history of similar pain. No history of any abdominal surgery.  Past Medical History  Diagnosis Date  . Back pain, chronic    Past Surgical History  Procedure Laterality Date  . Back surgery     History reviewed. No pertinent family history. History  Substance Use Topics  . Smoking status: Current Every Day Smoker    Types: Cigarettes  . Smokeless tobacco: Not on file  . Alcohol Use: No    Review of Systems  All systems reviewed and negative, other than as noted in HPI.   Allergies  Review of patient's allergies indicates no known allergies.  Home Medications   Current Outpatient Rx  Name  Route  Sig  Dispense  Refill  . ALPRAZolam (XANAX) 1 MG tablet   Oral   Take 1 mg by mouth 3 (three) times daily as needed. For anxiety         . oxycodone (ROXICODONE) 30 MG immediate release tablet   Oral   Take 30 mg by mouth 6 (six) times daily. For breakthrough pain          BP 170/85  Pulse 82  Temp(Src) 98.8 F (37.1 C) (Oral)  Resp 18  SpO2 96% Physical Exam  Nursing note and vitals reviewed. Constitutional: He appears well-developed and well-nourished. No distress.  HENT:  Head: Normocephalic and atraumatic.  Eyes: Conjunctivae are normal. Right eye  exhibits no discharge. Left eye exhibits no discharge.  Neck: Neck supple.  Cardiovascular: Normal rate, regular rhythm and normal heart sounds.  Exam reveals no gallop and no friction rub.   No murmur heard. Pulmonary/Chest: Effort normal and breath sounds normal. No respiratory distress.  Abdominal: Soft. He exhibits no distension. There is no tenderness.  Tenderness across lower abdomen with voluntary guarding. No rebound. No distention.  Genitourinary:  Equivocal right CVA tenderness.  Musculoskeletal: He exhibits no edema and no tenderness.  Neurological: He is alert.  Skin: Skin is warm and dry. He is not diaphoretic.  Psychiatric: He has a normal mood and affect. His behavior is normal. Thought content normal.    ED Course  Procedures (including critical care time) Labs Review Labs Reviewed  CBC WITH DIFFERENTIAL - Abnormal; Notable for the following:    WBC 11.0 (*)    MCHC 36.4 (*)    Neutro Abs 7.9 (*)    All other components within normal limits  COMPREHENSIVE METABOLIC PANEL - Abnormal; Notable for the following:    Potassium 5.7 (*)    Glucose, Bld 118 (*)    AST 48 (*)    All other components within normal limits  LIPASE, BLOOD  URINALYSIS, ROUTINE W REFLEX MICROSCOPIC   Imaging Review Ct Abdomen Pelvis W Contrast  09/24/2013   CLINICAL DATA:  Diffuse abdominal pain, diarrhea  EXAM: CT ABDOMEN AND PELVIS WITH  CONTRAST  TECHNIQUE: Multidetector CT imaging of the abdomen and pelvis was performed using the standard protocol following bolus administration of intravenous contrast.  CONTRAST:  100mL OMNIPAQUE IOHEXOL 300 MG/ML  SOLN  COMPARISON:  02/20/2011  FINDINGS: Lung bases are clear.  17 mm hypervascular lesion in the lateral left hepatic dome (series 2/ image 13). 20 mm hypervascular lesion in the medial segment left hepatic lobe (series 2/image 22). These findings are favored to reflect perfusion anomalies or flash filling hemangiomas. Suspected mild hepatic  steatosis with focal fatty sparing along the gallbladder fossa (Series 2/image 23). 8 mm probable cyst in the posterior segment right hepatic lobe (series 2/image 32).  Spleen, pancreas, and left adrenal gland are within normal limits. Stable 1.2 x 1.8 cm probable right adrenal adenoma.  Gallbladder is unremarkable. No intrahepatic or extrahepatic ductal dilatation.  Horseshoe kidney. 7 mm nonobstructing calculus in the medial right renal moiety (series 2/ image 45), previously 5 mm. 5 mm posterior right renal cyst (series 2/ image 44). No hydronephrosis.  No evidence of bowel obstruction. Normal appendix. No colonic wall thickening or inflammatory changes.  Atherosclerotic calcifications of the abdominal aorta and branch vessels.  Trace pelvic ascites (series 2/image 76).  No suspicious abdominopelvic lymphadenopathy.  Prostate is unremarkable.  Bladder is underdistended.  Mild degenerative changes at L4-5.  IMPRESSION: No evidence of bowel obstruction. Normal appendix. No colonic wall thickening or inflammatory changes.  Two hypervascular liver lesions measuring up to 2 cm, favored to reflect a perfusion anomaly is or flash filling hemangiomas. If definitive characterization is desired, consider MRI abdomen with/ without contrast.  Horseshoe kidney. 7 mm nonobstructing calculus in the medial right renal moiety. No ureteral or bladder calculi.  1.8 cm right adrenal adenoma.  Trace pelvic ascites, abnormal, although nonspecific and of uncertain etiology.   Electronically Signed   By: Charline BillsSriyesh  Krishnan M.D.   On: 09/24/2013 18:32    EKG Interpretation   None       MDM   1. Abdominal pain     48 yo male with abdominal pain. Patient is significantly tender on exam. Consider diverticulitis particularly with his change in bowel movements. Somewhat atypical for ureteral colic. Feel appendicitis is less likely. Gen. exam unremarkable. Plan IV access, IV fluids, pain medicines. CT scan the abdomen or pelvis.  Labs and urinalysis.  Work up is in pretty unremarkable. Minimal leukocytosis. This is nonspecific. UA normal. CT of the abdomen and pelvis without explanatory findings. Feel patient is safe for discharge at this point. He is already prescribed high doses of oxycodone for chronic pain. Emergent return precautions were discussed. outpatient followup otherwise.  Raeford RazorStephen Tarrance Januszewski, MD 09/24/13 [phone]
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
## 2013-09-24 NOTE — Discharge Instructions (Signed)
Abdominal Pain, Adult °Many things can cause abdominal pain. Usually, abdominal pain is not caused by a disease and will improve without treatment. It can often be observed and treated at home. Your health care provider will do a physical exam and possibly order blood tests and X-rays to help determine the seriousness of your pain. However, in many cases, more time must pass before a clear cause of the pain can be found. Before that point, your health care provider may not know if you need more testing or further treatment. °HOME CARE INSTRUCTIONS  °Monitor your abdominal pain for any changes. The following actions may help to alleviate any discomfort you are experiencing: °· Only take over-the-counter or prescription medicines as directed by your health care provider. °· Do not take laxatives unless directed to do so by your health care provider. °· Try a clear liquid diet (broth, tea, or water) as directed by your health care provider. Slowly move to a bland diet as tolerated. °SEEK MEDICAL CARE IF: °· You have unexplained abdominal pain. °· You have abdominal pain associated with nausea or diarrhea. °· You have pain when you urinate or have a bowel movement. °· You experience abdominal pain that wakes you in the night. °· You have abdominal pain that is worsened or improved by eating food. °· You have abdominal pain that is worsened with eating fatty foods. °SEEK IMMEDIATE MEDICAL CARE IF:  °· Your pain does not go away within 2 hours. °· You have a fever. °· You keep throwing up (vomiting). °· Your pain is felt only in portions of the abdomen, such as the right side or the left lower portion of the abdomen. °· You pass bloody or black tarry stools. °MAKE SURE YOU: °· Understand these instructions.   °· Will watch your condition.   °· Will get help right away if you are not doing well or get worse.   °Document Released: 06/08/2005 Document Revised: 06/19/2013 Document Reviewed: 05/08/2013 °Exit[employer]® Patient  Information ©2014 [employer]. ° °

## 2013-09-24 NOTE — ED Notes (Signed)
Pt presents to department for evaluation of diffuse abdominal pain. 8/10 pain at the time. Also states diarrhea. Pt is alert and oriented x4. Denies urinary symptoms.

## 2013-09-24 NOTE — ED Notes (Signed)
Patient transported to CT 

## 2014-06-21 ENCOUNTER — Emergency Department (HOSPITAL_COMMUNITY): Payer: No Typology Code available for payment source

## 2014-06-21 ENCOUNTER — Emergency Department (HOSPITAL_COMMUNITY)
Admission: EM | Admit: 2014-06-21 | Discharge: 2014-06-21 | Disposition: A | Discharge status: Home / Self Care | Payer: Self-pay | Attending: Emergency Medicine | Admitting: Emergency Medicine

## 2014-06-21 ENCOUNTER — Encounter (HOSPITAL_COMMUNITY): Payer: Self-pay | Admitting: Emergency Medicine

## 2014-06-21 DIAGNOSIS — R1030 Lower abdominal pain, unspecified: Secondary | ICD-10-CM

## 2014-06-21 DIAGNOSIS — G8929 Other chronic pain: Secondary | ICD-10-CM | POA: Insufficient documentation

## 2014-06-21 DIAGNOSIS — Y9289 Other specified places as the place of occurrence of the external cause: Secondary | ICD-10-CM | POA: Insufficient documentation

## 2014-06-21 DIAGNOSIS — S29092A Other injury of muscle and tendon of back wall of thorax, initial encounter: Secondary | ICD-10-CM | POA: Insufficient documentation

## 2014-06-21 DIAGNOSIS — M5441 Lumbago with sciatica, right side: Secondary | ICD-10-CM

## 2014-06-21 DIAGNOSIS — Y9389 Activity, other specified: Secondary | ICD-10-CM | POA: Insufficient documentation

## 2014-06-21 DIAGNOSIS — S3991XA Unspecified injury of abdomen, initial encounter: Secondary | ICD-10-CM | POA: Insufficient documentation

## 2014-06-21 DIAGNOSIS — S3992XA Unspecified injury of lower back, initial encounter: Secondary | ICD-10-CM | POA: Insufficient documentation

## 2014-06-21 DIAGNOSIS — Z720 Tobacco use: Secondary | ICD-10-CM | POA: Insufficient documentation

## 2014-06-21 DIAGNOSIS — M5442 Lumbago with sciatica, left side: Secondary | ICD-10-CM

## 2014-06-21 LAB — CBC WITH DIFFERENTIAL/PLATELET
Basophils Absolute: 0 10*3/uL (ref 0.0–0.1)
Basophils Relative: 0 % (ref 0–1)
EOS PCT: 1 % (ref 0–5)
Eosinophils Absolute: 0.1 10*3/uL (ref 0.0–0.7)
HCT: 46 % (ref 39.0–52.0)
HEMOGLOBIN: 16.2 g/dL (ref 13.0–17.0)
LYMPHS ABS: 3 10*3/uL (ref 0.7–4.0)
LYMPHS PCT: 32 % (ref 12–46)
MCH: 31.7 pg (ref 26.0–34.0)
MCHC: 35.2 g/dL (ref 30.0–36.0)
MCV: 90 fL (ref 78.0–100.0)
Monocytes Absolute: 0.4 10*3/uL (ref 0.1–1.0)
Monocytes Relative: 4 % (ref 3–12)
Neutro Abs: 5.8 10*3/uL (ref 1.7–7.7)
Neutrophils Relative %: 63 % (ref 43–77)
PLATELETS: 240 10*3/uL (ref 150–400)
RBC: 5.11 MIL/uL (ref 4.22–5.81)
RDW: 12.9 % (ref 11.5–15.5)
WBC: 9.3 10*3/uL (ref 4.0–10.5)

## 2014-06-21 LAB — URINALYSIS, ROUTINE W REFLEX MICROSCOPIC
Bilirubin Urine: NEGATIVE
Glucose, UA: NEGATIVE mg/dL
Hgb urine dipstick: NEGATIVE
KETONES UR: NEGATIVE mg/dL
LEUKOCYTES UA: NEGATIVE
Nitrite: NEGATIVE
PH: 5.5 (ref 5.0–8.0)
Protein, ur: NEGATIVE mg/dL
Specific Gravity, Urine: 1.019 (ref 1.005–1.030)
Urobilinogen, UA: 0.2 mg/dL (ref 0.0–1.0)

## 2014-06-21 LAB — COMPREHENSIVE METABOLIC PANEL
ALBUMIN: 3.8 g/dL (ref 3.5–5.2)
ALT: 12 U/L (ref 0–53)
AST: 12 U/L (ref 0–37)
Alkaline Phosphatase: 68 U/L (ref 39–117)
Anion gap: 15 (ref 5–15)
BUN: 11 mg/dL (ref 6–23)
CALCIUM: 9.1 mg/dL (ref 8.4–10.5)
CO2: 23 mEq/L (ref 19–32)
CREATININE: 0.89 mg/dL (ref 0.50–1.35)
Chloride: 98 mEq/L (ref 96–112)
GFR calc Af Amer: >90 mL/min (ref >90)
GFR calc non Af Amer: >90 mL/min (ref >90)
Glucose, Bld: 184 mg/dL — ABNORMAL HIGH (ref 70–99)
Potassium: 3.9 mEq/L (ref 3.7–5.3)
SODIUM: 136 meq/L — AB (ref 137–147)
TOTAL PROTEIN: 8.3 g/dL (ref 6.0–8.3)
Total Bilirubin: 0.3 mg/dL (ref 0.3–1.2)

## 2014-06-21 MED ORDER — IOHEXOL 300 MG/ML  SOLN
100.0000 mL | Freq: Once | INTRAMUSCULAR | Status: AC | PRN
  Administered 2014-06-21: 100 mL via INTRAVENOUS

## 2014-06-21 MED ORDER — OXYCODONE-ACETAMINOPHEN 5-325 MG PO TABS
1.0000 | ORAL_TABLET | Freq: Once | ORAL | Status: AC
  Administered 2014-06-21: 1 via ORAL
  Filled 2014-06-21: qty 1

## 2014-06-21 MED ORDER — OXYCODONE-ACETAMINOPHEN 5-325 MG PO TABS: 1.0000 | ORAL_TABLET | ORAL | Status: DC | PRN

## 2014-06-21 MED ORDER — METHOCARBAMOL 500 MG PO TABS: 500.0000 mg | ORAL_TABLET | Freq: Two times a day (BID) | ORAL | Status: DC

## 2014-06-21 MED ORDER — HYDROMORPHONE HCL 1 MG/ML IJ SOLN
1.0000 mg | Freq: Once | INTRAMUSCULAR | Status: AC
  Administered 2014-06-21: 1 mg via INTRAVENOUS
  Filled 2014-06-21: qty 1

## 2014-06-21 MED ORDER — ONDANSETRON 4 MG PO TBDP
8.0000 mg | ORAL_TABLET | Freq: Once | ORAL | Status: AC
Start: 2014-06-21 — End: 2014-06-21
  Administered 2014-06-21: 8 mg via ORAL
  Filled 2014-06-21: qty 2

## 2014-06-21 MED ORDER — IOHEXOL 300 MG/ML  SOLN: 25.0000 mL | Freq: Once | INTRAMUSCULAR | Status: DC | PRN

## 2014-06-21 MED ORDER — MORPHINE SULFATE 4 MG/ML IJ SOLN
4.0000 mg | Freq: Once | INTRAMUSCULAR | Status: AC
  Administered 2014-06-21: 4 mg via INTRAVENOUS
  Filled 2014-06-21: qty 1

## 2014-06-21 MED ORDER — LORAZEPAM 2 MG/ML IJ SOLN
INTRAMUSCULAR | Status: AC
  Filled 2014-06-21: qty 1
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
## 2014-06-22 NOTE — ED Provider Notes (Signed)
Medical screening examination/treatment/procedure(s) were performed by non-physician practitioner and as supervising physician I was immediately available for consultation/collaboration.    Sharonica Kraszewski D Liddy Deam, MD 06/22/14 1029 

## 2015-01-28 ENCOUNTER — Emergency Department (HOSPITAL_COMMUNITY): Payer: Self-pay

## 2015-01-28 ENCOUNTER — Encounter (HOSPITAL_COMMUNITY): Payer: Self-pay | Admitting: Emergency Medicine

## 2015-01-28 ENCOUNTER — Emergency Department (HOSPITAL_COMMUNITY)
Admission: EM | Admit: 2015-01-28 | Discharge: 2015-01-28 | Disposition: A | Discharge status: Home / Self Care | Payer: Self-pay | Attending: Emergency Medicine | Admitting: Emergency Medicine

## 2015-01-28 DIAGNOSIS — Z79899 Other long term (current) drug therapy: Secondary | ICD-10-CM | POA: Insufficient documentation

## 2015-01-28 DIAGNOSIS — R1084 Generalized abdominal pain: Secondary | ICD-10-CM | POA: Insufficient documentation

## 2015-01-28 DIAGNOSIS — R739 Hyperglycemia, unspecified: Secondary | ICD-10-CM | POA: Insufficient documentation

## 2015-01-28 DIAGNOSIS — Z720 Tobacco use: Secondary | ICD-10-CM | POA: Insufficient documentation

## 2015-01-28 DIAGNOSIS — R197 Diarrhea, unspecified: Secondary | ICD-10-CM | POA: Insufficient documentation

## 2015-01-28 DIAGNOSIS — G8929 Other chronic pain: Secondary | ICD-10-CM | POA: Insufficient documentation

## 2015-01-28 LAB — COMPREHENSIVE METABOLIC PANEL WITH GFR
ALT: 13 U/L — ABNORMAL LOW (ref 17–63)
AST: 15 U/L (ref 15–41)
Albumin: 3.7 g/dL (ref 3.5–5.0)
Alkaline Phosphatase: 64 U/L (ref 38–126)
Anion gap: 10 (ref 5–15)
BUN: 6 mg/dL (ref 6–20)
CO2: 23 mmol/L (ref 22–32)
Calcium: 9.3 mg/dL (ref 8.9–10.3)
Chloride: 105 mmol/L (ref 101–111)
Creatinine, Ser: 0.89 mg/dL (ref 0.61–1.24)
GFR calc Af Amer: >60 mL/min (ref >60)
GFR calc non Af Amer: >60 mL/min (ref >60)
Glucose, Bld: 199 mg/dL — ABNORMAL HIGH (ref 65–99)
Potassium: 4.1 mmol/L (ref 3.5–5.1)
Sodium: 138 mmol/L (ref 135–145)
Total Bilirubin: 0.5 mg/dL (ref 0.3–1.2)
Total Protein: 7.3 g/dL (ref 6.5–8.1)

## 2015-01-28 LAB — CBC WITH DIFFERENTIAL/PLATELET
Basophils Absolute: 0 10*3/uL (ref 0.0–0.1)
Basophils Relative: 0 % (ref 0–1)
EOS ABS: 0.1 10*3/uL (ref 0.0–0.7)
Eosinophils Relative: 1 % (ref 0–5)
HEMATOCRIT: 47.8 % (ref 39.0–52.0)
Hemoglobin: 16.4 g/dL (ref 13.0–17.0)
Lymphocytes Relative: 30 % (ref 12–46)
Lymphs Abs: 2.8 10*3/uL (ref 0.7–4.0)
MCH: 30.4 pg (ref 26.0–34.0)
MCHC: 34.3 g/dL (ref 30.0–36.0)
MCV: 88.7 fL (ref 78.0–100.0)
MONO ABS: 0.3 10*3/uL (ref 0.1–1.0)
MONOS PCT: 3 % (ref 3–12)
Neutro Abs: 6.3 10*3/uL (ref 1.7–7.7)
Neutrophils Relative %: 66 % (ref 43–77)
PLATELETS: 225 10*3/uL (ref 150–400)
RBC: 5.39 MIL/uL (ref 4.22–5.81)
RDW: 12.7 % (ref 11.5–15.5)
WBC: 9.5 10*3/uL (ref 4.0–10.5)

## 2015-01-28 LAB — URINALYSIS, ROUTINE W REFLEX MICROSCOPIC
Bilirubin Urine: NEGATIVE
Glucose, UA: NEGATIVE mg/dL
HGB URINE DIPSTICK: NEGATIVE
Ketones, ur: NEGATIVE mg/dL
LEUKOCYTES UA: NEGATIVE
Nitrite: NEGATIVE
PROTEIN: NEGATIVE mg/dL
Specific Gravity, Urine: 1.018 (ref 1.005–1.030)
UROBILINOGEN UA: 0.2 mg/dL (ref 0.0–1.0)
pH: 5.5 (ref 5.0–8.0)

## 2015-01-28 LAB — LIPASE, BLOOD: Lipase: 18 U/L — ABNORMAL LOW (ref 22–51)

## 2015-01-28 MED ORDER — HYDROMORPHONE HCL 1 MG/ML IJ SOLN
1.0000 mg | Freq: Once | INTRAMUSCULAR | Status: AC
  Administered 2015-01-28: 1 mg via INTRAVENOUS
  Filled 2015-01-28: qty 1

## 2015-01-28 MED ORDER — HYDROMORPHONE HCL 1 MG/ML IJ SOLN
0.5000 mg | Freq: Once | INTRAMUSCULAR | Status: AC
  Administered 2015-01-28: 0.5 mg via INTRAVENOUS
  Filled 2015-01-28: qty 1

## 2015-01-28 MED ORDER — ONDANSETRON HCL 4 MG/2ML IJ SOLN
4.0000 mg | Freq: Once | INTRAMUSCULAR | Status: AC
  Administered 2015-01-28: 4 mg via INTRAVENOUS
  Filled 2015-01-28: qty 2

## 2015-01-28 MED ORDER — ONDANSETRON 4 MG PO TBDP: 4.0000 mg | ORAL_TABLET | Freq: Three times a day (TID) | ORAL | Status: DC | PRN

## 2015-01-28 MED ORDER — OXYCODONE-ACETAMINOPHEN 5-325 MG PO TABS: 1.0000 | ORAL_TABLET | ORAL | Status: DC | PRN

## 2015-01-28 MED ORDER — SODIUM CHLORIDE 0.9 % IV BOLUS (SEPSIS)
1000.0000 mL | Freq: Once | INTRAVENOUS | Status: AC
  Administered 2015-01-28: 1000 mL via INTRAVENOUS

## 2015-01-28 MED ORDER — IOHEXOL 300 MG/ML  SOLN
80.0000 mL | Freq: Once | INTRAMUSCULAR | Status: AC | PRN
  Administered 2015-01-28: 100 mL via INTRAVENOUS

## 2015-01-28 NOTE — ED Notes (Signed)
Pt sts RUQ abd pain with some diarrhea starting today

## 2015-01-28 NOTE — ED Provider Notes (Signed)
Assumed care from PA Greene at shift change.  Briefly 49 y.o. M here with generalized abdominal pain.  Pain better when sitting with knees to chest, worse with straightening legs.  No reported fevers.  Some diarrhea noted, non-bloody.  Lab work thus far is overall reassuring-- glucose elevated without evidence of DKA.  Plan:  CT abdomen/pelvis pending.  Will follow results and dispo.  Results for orders placed or performed during the hospital encounter of 01/28/15  CBC with Differential  Result Value Ref Range   WBC 9.5 4.0 - 10.5 K/uL   RBC 5.39 4.22 - 5.81 MIL/uL   Hemoglobin 16.4 13.0 - 17.0 g/dL   HCT 09.847.8 11.939.0 - 14.752.0 %   MCV 88.7 78.0 - 100.0 fL   MCH 30.4 26.0 - 34.0 pg   MCHC 34.3 30.0 - 36.0 g/dL   RDW 82.912.7 56.211.5 - 13.015.5 %   Platelets 225 150 - 400 K/uL   Neutrophils Relative % 66 43 - 77 %   Neutro Abs 6.3 1.7 - 7.7 K/uL   Lymphocytes Relative 30 12 - 46 %   Lymphs Abs 2.8 0.7 - 4.0 K/uL   Monocytes Relative 3 3 - 12 %   Monocytes Absolute 0.3 0.1 - 1.0 K/uL   Eosinophils Relative 1 0 - 5 %   Eosinophils Absolute 0.1 0.0 - 0.7 K/uL   Basophils Relative 0 0 - 1 %   Basophils Absolute 0.0 0.0 - 0.1 K/uL  Comprehensive metabolic panel  Result Value Ref Range   Sodium 138 135 - 145 mmol/L   Potassium 4.1 3.5 - 5.1 mmol/L   Chloride 105 101 - 111 mmol/L   CO2 23 22 - 32 mmol/L   Glucose, Bld 199 (H) 65 - 99 mg/dL   BUN 6 6 - 20 mg/dL   Creatinine, Ser 8.650.89 0.61 - 1.24 mg/dL   Calcium 9.3 8.9 - 78.410.3 mg/dL   Total Protein 7.3 6.5 - 8.1 g/dL   Albumin 3.7 3.5 - 5.0 g/dL   AST 15 15 - 41 U/L   ALT 13 (L) 17 - 63 U/L   Alkaline Phosphatase 64 38 - 126 U/L   Total Bilirubin 0.5 0.3 - 1.2 mg/dL   GFR calc non Af Amer >60 >60 mL/min   GFR calc Af Amer >60 >60 mL/min   Anion gap 10 5 - 15  Urinalysis, Routine w reflex microscopic  Result Value Ref Range   Color, Urine YELLOW YELLOW   APPearance CLEAR CLEAR   Specific Gravity, Urine 1.018 1.005 - 1.030   pH 5.5 5.0 - 8.0    Glucose, UA NEGATIVE NEGATIVE mg/dL   Hgb urine dipstick NEGATIVE NEGATIVE   Bilirubin Urine NEGATIVE NEGATIVE   Ketones, ur NEGATIVE NEGATIVE mg/dL   Protein, ur NEGATIVE NEGATIVE mg/dL   Urobilinogen, UA 0.2 0.0 - 1.0 mg/dL   Nitrite NEGATIVE NEGATIVE   Leukocytes, UA NEGATIVE NEGATIVE  Lipase, blood  Result Value Ref Range   Lipase 18 (L) 22 - 51 U/L   Ct Abdomen Pelvis W Contrast  01/28/2015   CLINICAL DATA:  Right lower quadrant pain with diarrhea  EXAM: CT ABDOMEN AND PELVIS WITH CONTRAST  TECHNIQUE: Multidetector CT imaging of the abdomen and pelvis was performed using the standard protocol following bolus administration of intravenous contrast.  CONTRAST:  100mL OMNIPAQUE IOHEXOL 300 MG/ML  SOLN  COMPARISON:  06/21/2014  FINDINGS: Lung bases demonstrate some mild dependent atelectatic changes. No focal confluent infiltrate is seen.  The liver shows  a tiny hypodensity measuring 1 cm likely related to a cyst. The spleen, adrenal glands and pancreas are within normal limits with the exception of a 2.4 cm hypodense structure in the right adrenal gland. This has increased in size slightly in the interval from the prior exam but still likely represents a small adrenal adenoma. The gallbladder is well distended and within normal limits. There is evidence of a horseshoe kidney with a 7 mm calculus within the midportion near the isthmus. This is stable from the prior exam.  Diffuse aortoiliac calcifications are noted without aneurysmal dilatation. The appendix is within normal limits. The bladder is well distended. No pelvic mass lesion or sidewall abnormality is noted. Degenerative changes of the lower lumbar spine are noted.  IMPRESSION: Stable horseshoe kidney with mid nonobstructing renal calculus.  Stable right adrenal adenoma although slightly increased in size from the prior exam.  No acute abnormality is noted.   Electronically Signed   By: Alcide CleverMark  Lukens M.D.   On: 01/28/2015 16:56    5:04  PM CT with horseshoe kidney with non-obstructing renal calculi and right adrenal adenoma, largely unchanged from prior.  Patient has been given pain meds in the ED, feeling better at this time.  VS remain stable.  Results were discussed with patient, will be d/c home with pain meds and anti-emetics.  Encouraged rest and push fluids at home.  FU with PCP.  Discussed plan with patient, he/she acknowledged understanding and agreed with plan of care.  Return precautions given for new or worsening symptoms.  Garlon HatchetLisa M Manreet Kiernan, PA-C 01/28/15 1744  Mancel BaleElliott Wentz, MD 01/29/15 [phone]

## 2015-01-28 NOTE — ED Provider Notes (Signed)
CSN: [id number]     Arrival date & time 01/28/15  1219 History  This chart was scribed for Dorthula Matasiffany G Texie Tupou, PA-C, working with Blake DivineJohn Wofford, MD by Elon SpannerGarrett Cook, ED Scribe. This patient was seen in room TR11C/TR11C and the patient's care was started at 1:45 PM.   Chief Complaint  Patient presents with  . Abdominal Pain   The history is provided by the patient. No language interpreter was used.   HPI Comments: Omar Castillo is a 49 y.o. male who presents to the Emergency Department complaining of constant, worsening, generalized, severe abdominal pain onset 4:00 am today with associated nausea and non-bloody diarrhea.  Patient denies previous history of similar episodes or any gastrointestinal conditions.  Patient denies cholecystectomy, appendectomy or previous abdominal surgeries.  He denies history of nephrolithiasis.  Patient denies vomiting, fevers, cough, CP, SOB, lower extremity swelling, back pain, lateralization of pain, headache, weakness or confusion.  NKA.  Past Medical History  Diagnosis Date  . Back pain, chronic    Past Surgical History  Procedure Laterality Date  . Back surgery     History reviewed. No pertinent family history. History  Substance Use Topics  . Smoking status: Current Every Day Smoker    Types: Cigarettes  . Smokeless tobacco: Not on file  . Alcohol Use: No    Review of Systems  Gastrointestinal: Positive for abdominal pain.  All other systems reviewed and are negative.   Allergies  Review of patient's allergies indicates no known allergies.  Home Medications   Prior to Admission medications   Medication Sig Start Date End Date Taking? Authorizing Provider  ibuprofen (ADVIL,MOTRIN) 200 MG tablet Take 400 mg by mouth every 6 (six) hours as needed for moderate pain.    Historical Provider, MD  methocarbamol (ROBAXIN) 500 MG tablet Take 1 tablet (500 mg total) by mouth 2 (two) times daily. 06/21/14   Jennifer Piepenbrink, PA-C   oxyCODONE-acetaminophen (PERCOCET/ROXICET) 5-325 MG per tablet Take 1 tablet by mouth every 4 (four) hours as needed for severe pain. May take 2 tablets PO q 6 hours for severe pain - Do not take with Tylenol as this tablet already contains tylenol 06/21/14   Jennifer Piepenbrink, PA-C   BP 126/83 mmHg  Pulse 104  Temp(Src) 97.8 F (36.6 C) (Oral)  Resp 18  Ht 5\' 7"  (1.702 m)  Wt 184 lb (83.462 kg)  BMI 28.81 kg/m2  SpO2 99% Physical Exam  Constitutional: He is oriented to person, place, and time. He appears well-developed and well-nourished. No distress.  HENT:  Head: Normocephalic and atraumatic.  Eyes: Conjunctivae and EOM are normal.  Neck: Neck supple. No tracheal deviation present.  Cardiovascular: Normal rate.   Pulmonary/Chest: Effort normal and breath sounds normal. No respiratory distress. He has no decreased breath sounds. He has no wheezes.  Abdominal: Bowel sounds are normal. He exhibits no distension and no fluid wave. There is tenderness (diffuse). There is guarding. There is no rigidity and no rebound.  Pt has knees bent to chest, this helps relieve the pain.  Musculoskeletal: Normal range of motion.  Neurological: He is alert and oriented to person, place, and time.  Skin: Skin is warm and dry.  Psychiatric: He has a normal mood and affect. His behavior is normal.  Nursing note and vitals reviewed.   ED Course  Procedures (including critical care time)  DIAGNOSTIC STUDIES: Oxygen Saturation is 99% on RA, normal by my interpretation.    COORDINATION OF CARE:  1:50 PM  Will order abdominal CT scan.  Patient acknowledges and agrees with plan.    Labs Review Labs Reviewed  COMPREHENSIVE METABOLIC PANEL - Abnormal; Notable for the following:    Glucose, Bld 199 (*)    ALT 13 (*)    All other components within normal limits  LIPASE, BLOOD - Abnormal; Notable for the following:    Lipase 18 (*)    All other components within normal limits  CBC WITH  DIFFERENTIAL/PLATELET  URINALYSIS, ROUTINE W REFLEX MICROSCOPIC    Imaging Review No results found.   EKG Interpretation None      MDM   Final diagnoses:  Diarrhea  Diffuse abdominal pain    Medications  ondansetron (ZOFRAN) injection 4 mg (not administered)  HYDROmorphone (DILAUDID) injection 1 mg (not administered)  sodium chloride 0.9 % bolus 1,000 mL (not administered)    At the end of shift patient handed off to [place] CityLisa Sanders, PA-C. Pt has a CT scan pending for evaluate of abdominal pains. If CT scan is negative and patient is stable, suspect discharge.   I personally performed the services described in this documentation, which was scribed in my presence. The recorded information has been reviewed and is accurate.    Marlon Peliffany Ezzard Ditmer, PA-C 01/29/15 [id number]  Blake DivineJohn Wofford, MD 01/29/15 [id number]

## 2015-01-28 NOTE — ED Notes (Signed)
Pt returned from CT °

## 2015-01-28 NOTE — Discharge Instructions (Signed)
Take the prescribed medication as directed.  Make sure to drink plenty of fluids to stay hydrated. °Follow-up with your primary care physician. °Return to the ED for new or worsening symptoms. ° °

## 2015-09-07 ENCOUNTER — Encounter (HOSPITAL_COMMUNITY): Payer: Self-pay | Admitting: *Deleted

## 2015-09-07 ENCOUNTER — Emergency Department (HOSPITAL_COMMUNITY)
Admission: EM | Admit: 2015-09-07 | Discharge: 2015-09-07 | Disposition: A | Discharge status: Home / Self Care | Payer: Self-pay | Attending: Emergency Medicine | Admitting: Emergency Medicine

## 2015-09-07 ENCOUNTER — Emergency Department (HOSPITAL_COMMUNITY): Payer: Self-pay

## 2015-09-07 DIAGNOSIS — M5416 Radiculopathy, lumbar region: Secondary | ICD-10-CM | POA: Insufficient documentation

## 2015-09-07 DIAGNOSIS — Y9389 Activity, other specified: Secondary | ICD-10-CM | POA: Insufficient documentation

## 2015-09-07 DIAGNOSIS — Y998 Other external cause status: Secondary | ICD-10-CM | POA: Insufficient documentation

## 2015-09-07 DIAGNOSIS — Y92410 Unspecified street and highway as the place of occurrence of the external cause: Secondary | ICD-10-CM | POA: Insufficient documentation

## 2015-09-07 DIAGNOSIS — F17210 Nicotine dependence, cigarettes, uncomplicated: Secondary | ICD-10-CM | POA: Insufficient documentation

## 2015-09-07 DIAGNOSIS — G8929 Other chronic pain: Secondary | ICD-10-CM | POA: Insufficient documentation

## 2015-09-07 DIAGNOSIS — Z79899 Other long term (current) drug therapy: Secondary | ICD-10-CM | POA: Insufficient documentation

## 2015-09-07 DIAGNOSIS — T148 Other injury of unspecified body region: Secondary | ICD-10-CM | POA: Insufficient documentation

## 2015-09-07 MED ORDER — OXYCODONE-ACETAMINOPHEN 5-325 MG PO TABS
1.0000 | ORAL_TABLET | Freq: Once | ORAL | Status: AC
  Administered 2015-09-07: 1 via ORAL
  Filled 2015-09-07: qty 1

## 2015-09-07 MED ORDER — HYDROMORPHONE HCL 1 MG/ML IJ SOLN
2.0000 mg | Freq: Once | INTRAMUSCULAR | Status: AC
Start: 2015-09-07 — End: 2015-09-07
  Administered 2015-09-07: 2 mg via INTRAVENOUS
  Filled 2015-09-07: qty 2

## 2015-09-07 NOTE — ED Notes (Signed)
Doctor at bedside.
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
## 2015-09-07 NOTE — ED Notes (Signed)
Patient transported to MRI 
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
## 2016-03-09 ENCOUNTER — Emergency Department (HOSPITAL_COMMUNITY): Payer: Self-pay

## 2016-03-09 ENCOUNTER — Emergency Department (HOSPITAL_COMMUNITY)
Admission: EM | Admit: 2016-03-09 | Discharge: 2016-03-09 | Disposition: A | Discharge status: Home / Self Care | Payer: Self-pay | Attending: Emergency Medicine | Admitting: Emergency Medicine

## 2016-03-09 ENCOUNTER — Encounter (HOSPITAL_COMMUNITY): Payer: Self-pay | Admitting: Emergency Medicine

## 2016-03-09 DIAGNOSIS — M545 Low back pain: Secondary | ICD-10-CM | POA: Insufficient documentation

## 2016-03-09 DIAGNOSIS — Y929 Unspecified place or not applicable: Secondary | ICD-10-CM | POA: Insufficient documentation

## 2016-03-09 DIAGNOSIS — W19XXXA Unspecified fall, initial encounter: Secondary | ICD-10-CM

## 2016-03-09 DIAGNOSIS — Y939 Activity, unspecified: Secondary | ICD-10-CM | POA: Insufficient documentation

## 2016-03-09 DIAGNOSIS — M7918 Myalgia, other site: Secondary | ICD-10-CM

## 2016-03-09 DIAGNOSIS — W0110XA Fall on same level from slipping, tripping and stumbling with subsequent striking against unspecified object, initial encounter: Secondary | ICD-10-CM | POA: Insufficient documentation

## 2016-03-09 DIAGNOSIS — R51 Headache: Secondary | ICD-10-CM | POA: Insufficient documentation

## 2016-03-09 DIAGNOSIS — Y999 Unspecified external cause status: Secondary | ICD-10-CM | POA: Insufficient documentation

## 2016-03-09 DIAGNOSIS — F17210 Nicotine dependence, cigarettes, uncomplicated: Secondary | ICD-10-CM | POA: Insufficient documentation

## 2016-03-09 MED ORDER — KETOROLAC TROMETHAMINE 30 MG/ML IJ SOLN
30.0000 mg | Freq: Once | INTRAMUSCULAR | Status: AC
  Administered 2016-03-09: 30 mg via INTRAMUSCULAR
  Filled 2016-03-09 (×2): qty 1

## 2016-03-09 MED ORDER — OXYCODONE-ACETAMINOPHEN 5-325 MG PO TABS
2.0000 | ORAL_TABLET | Freq: Once | ORAL | Status: AC
  Administered 2016-03-09: 2 via ORAL
  Filled 2016-03-09: qty 2

## 2016-03-09 NOTE — ED Provider Notes (Signed)
CSN: [id number]     Arrival date & time 03/09/16  1439 History  By signing my name below, I, Octavia Heirrianna Nassar, attest that this documentation has been prepared under the direction and in the presence of General MillsBenjamin Broghan Pannone, PA-C.  Electronically Signed: Octavia HeirArianna Nassar, ED Scribe. 03/09/2016. 3:30 PM.    Chief Complaint  Patient presents with  . Fall     The history is provided by the patient and the EMS personnel. No language interpreter was used.   HPI Comments: Omar Castillo is a 50 y.o. male brought in by ambulance, who has a PMhx of chronic low back pain presents to the Emergency Department complaining of sudden onset, gradual worsening, moderate, lower back pain that radiates into his left leg, neck pain, and a mild headache s/p a fall that occurred PTA. Pt reports that [place] [employer] [place] he [place] and fell and hit his head. He reports he does not remember much before slipping and falling and it unsure if he lost consciousness. Pt has a hx of three prior back surgeries all with the pain radiating down his left leg which he says was due to ruptured discs. Pt has a c-collar in place. He denies numbness, weakness, nausea, vomiting, rhinorrhea, or ear discharge. Pt is not on any blood thinners.  Past Medical History  Diagnosis Date  . Back pain, chronic    Past Surgical History  Procedure Laterality Date  . Back surgery     No family history on file. Social History  Substance Use Topics  . Smoking status: Current Every Day Smoker -- 0.50 packs/day    Types: Cigarettes  . Smokeless tobacco: None  . Alcohol Use: No    Review of Systems   A complete 10 system review of systems was obtained and all systems are negative except as noted in the HPI and PMH.     Allergies  Review of patient's allergies indicates no known allergies.  Home Medications   Prior to Admission medications   Medication Sig Start Date End Date Taking? Authorizing Provider  ALPRAZolam Prudy Feeler(XANAX) 1 MG  tablet Take 1 mg by mouth 2 (two) times daily as needed. 08/20/15   Historical Provider, MD  ibuprofen (ADVIL,MOTRIN) 200 MG tablet Take 400 mg by mouth every 6 (six) hours as needed for moderate pain.    Historical Provider, MD  Oxycodone HCl 20 MG TABS Take 20 mg by mouth 5 (five) times daily as needed (pain).  08/20/15   Historical Provider, MD   BP 145/84 mmHg  Pulse 72  Temp(Src) 98.2 F (36.8 C) (Oral)  Resp 16  Ht 5\' 7"  (1.702 m)  Wt 84.369 kg  BMI 29.12 kg/m2  SpO2 97% Physical Exam  Constitutional: He is oriented to person, place, and time. He appears well-developed and well-nourished.  Overall well-appearing, but does not fully cooperate with exam.  HENT:  Head: Normocephalic and atraumatic.  Eyes: EOM are normal.  Neck: Normal range of motion.  C-collar in place, tenderness along C-spine.  Cardiovascular: Normal rate, regular rhythm, normal heart sounds and intact distal pulses.   Pulmonary/Chest: Effort normal and breath sounds normal. No respiratory distress.  Abdominal: Soft. He exhibits no distension. There is no tenderness.  Musculoskeletal: Normal range of motion. He exhibits tenderness.  TTP along cervical and lumbar over bony processes Bony pelvis diffusely tender Full ROM of lower extremities Motor strength and sensation intact for baseline  Neurological: He is alert and oriented to person, place, and time.  Skin: Skin is warm and dry.  Psychiatric: He has a normal mood and affect. Judgment normal.  Nursing note and vitals reviewed.   ED Course  Procedures  DIAGNOSTIC STUDIES: Oxygen Saturation is 95% on RA, adequate by my interpretation.  COORDINATION OF CARE:  3:28 PM Will order CT of head, neck and back. Discussed treatment plan with pt at bedside and pt agreed to plan.  Labs Review Labs Reviewed - No data to display  Imaging Review Ct Head Wo Contrast  03/09/2016  CLINICAL DATA:  Pain after fall EXAM: CT HEAD WITHOUT CONTRAST CT CERVICAL SPINE  WITHOUT CONTRAST TECHNIQUE: Multidetector CT imaging of the head and cervical spine was performed following the standard protocol without intravenous contrast. Multiplanar CT image reconstructions of the cervical spine were also generated. COMPARISON:  March 23, 2009 FINDINGS: CT HEAD FINDINGS There is mucosal thickening in the right maxillary sinus. Paranasal sinuses, mastoid air cells, and bones are otherwise normal. Extracranial soft tissues are within normal limits. No subdural, epidural, or subarachnoid hemorrhage. No mass, mass effect, or midline shift. Ventricles and sulci are normal for age. Cerebellum, brainstem, and basal cisterns are normal. No acute cortical ischemia or infarct. CT CERVICAL SPINE FINDINGS No malalignment. No fractures. Multilevel degenerative changes with anterior osteophytes. A few tiny posterior osteophytes are also seen at C5-6 and C6-7. No other abnormalities. IMPRESSION: 1. No acute intracranial abnormality. 2. No fracture or traumatic malalignment in the cervical spine. Mild degenerative changes. 3. Mild mucosal thickening in the right maxillary sinus. Electronically Signed   By: Gerome Samavid  Williams III M.D   On: 03/09/2016 16:35   Ct Cervical Spine Wo Contrast  03/09/2016  CLINICAL DATA:  Pain after fall EXAM: CT HEAD WITHOUT CONTRAST CT CERVICAL SPINE WITHOUT CONTRAST TECHNIQUE: Multidetector CT imaging of the head and cervical spine was performed following the standard protocol without intravenous contrast. Multiplanar CT image reconstructions of the cervical spine were also generated. COMPARISON:  March 23, 2009 FINDINGS: CT HEAD FINDINGS There is mucosal thickening in the right maxillary sinus. Paranasal sinuses, mastoid air cells, and bones are otherwise normal. Extracranial soft tissues are within normal limits. No subdural, epidural, or subarachnoid hemorrhage. No mass, mass effect, or midline shift. Ventricles and sulci are normal for age. Cerebellum, brainstem, and basal  cisterns are normal. No acute cortical ischemia or infarct. CT CERVICAL SPINE FINDINGS No malalignment. No fractures. Multilevel degenerative changes with anterior osteophytes. A few tiny posterior osteophytes are also seen at C5-6 and C6-7. No other abnormalities. IMPRESSION: 1. No acute intracranial abnormality. 2. No fracture or traumatic malalignment in the cervical spine. Mild degenerative changes. 3. Mild mucosal thickening in the right maxillary sinus. Electronically Signed   By: Gerome Samavid  Williams III M.D   On: 03/09/2016 16:35   Ct Lumbar Spine Wo Contrast  03/09/2016  CLINICAL DATA:  Back pain and head injury after falling in [place] [employer] today. Initial encounter. EXAM: CT LUMBAR SPINE WITHOUT CONTRAST TECHNIQUE: Multidetector CT imaging of the lumbar spine was performed without intravenous contrast administration. Multiplanar CT image reconstructions were also generated. COMPARISON:  Abdominal pelvic CT 01/28/2015 lumbar MRI 09/07/2015. FINDINGS: Segmentation: Conventional anatomy with the last open disc space designated L5-S1. Alignment: Minimal convex left scoliosis. The lateral alignment is normal. Vertebrae: No worrisome osseous lesion, acute fracture or pars defect. The visualized sacroiliac joints appear unremarkable. Paraspinal and other soft tissues: No significant paraspinal findings. Horseshoe kidney, nephrolithiasis, a right adrenal adenoma and a cyst in the right hepatic lobe are noted,  stable from prior CT. The liver demonstrates low density consistent with steatosis. Disc levels: No significant disc space findings from T11-12 through L2-3. L3-4: Mild loss of disc height with annular disc bulging eccentric to the right and mild facet and ligamentous hypertrophy. Stable mild right foraminal narrowing. L4-5: The spinal canal remains adequately decompressed by the previous left laminectomy. There is chronic degenerative disc disease with loss of disc height, annular disc bulging and endplate  osteophytes. There is stable mild narrowing of the left lateral recess and left foramen without nerve root encroachment. L5-S1: Stable annular disc bulging with asymmetric endplate osteophytes and facet hypertrophy on the left contributing to moderate left foraminal narrowing. The right foramen and lateral recesses are patent. IMPRESSION: 1. No acute posttraumatic findings. 2. Generally stable lumbar spondylosis compared with previous MRI from 6 months ago. There is some chronic foraminal narrowing, worse on the left at L5-S1. 3. Stable incidental abdominal findings, including a horseshoe kidney and a nonobstructing renal calculus. Electronically Signed   By: Carey Bullocks M.D.   On: 03/09/2016 16:39   I have personally reviewed and evaluated these images and lab results as part of my medical decision-making.   EKG Interpretation None      MDM  Patient with chronic back pain presents for evaluation after mechanical fall. Patient reports headache, head injury and is unsure about LOC. Also reports neck and low back pain and has tenderness along bony processes. Remains neurovascularly intact, but does not give good effort. Will obtain CT head, neck and lumbar spine. Imaging is unremarkable for any acute abnormalities. Patient to be discharged home, continue with home pain medicine regimen. Follow-up with PCP as needed. Return precautions discussed. Final diagnoses:  Fall, initial encounter  Musculoskeletal pain   I personally performed the services described in this documentation, which was scribed in my presence. The recorded information has been reviewed and is accurate.   Joycie Peek, PA-C 03/09/16 1911  Derwood Kaplan, MD 03/10/16 0001

## 2016-03-09 NOTE — ED Notes (Signed)
Patient transported to CT 

## 2016-03-09 NOTE — Discharge Instructions (Signed)
There does not appear to be an emergent cause for your symptoms at this time. The CT scans of your head, neck and back are all reassuring. Continue with her home pain medicines. Follow-up with your doctor as needed. Return to ED for any new or worsening symptoms.

## 2016-03-09 NOTE — ED Notes (Signed)
PA at bedside.

## 2016-03-09 NOTE — ED Notes (Signed)
Bed: [id number] Expected date: 03/09/16 Expected time:  Means of arrival:  Comments: EMS FALL/HIP PAIN

## 2016-03-09 NOTE — ED Notes (Signed)
[facility]: pt reports slipping and falling while at [employer]. Reports hitting head with possible LOC. Pain in neck, shoulder and back pain. Has chronic back pain/surgical hx. No obvious deformities noted. Vitals stable. 0-10 PAIN  Vitals 144/84 84 hr 16 rr 96% RA  154 CBG

## 2017-05-13 ENCOUNTER — Inpatient Hospital Stay (HOSPITAL_COMMUNITY)
Admission: EM | Admit: 2017-05-13 | Discharge: 2017-05-17 | DRG: 917 | Disposition: A | Discharge status: Home / Self Care | Payer: Self-pay | Attending: Family Medicine | Admitting: Family Medicine

## 2017-05-13 ENCOUNTER — Emergency Department (HOSPITAL_COMMUNITY): Payer: Self-pay

## 2017-05-13 ENCOUNTER — Encounter (HOSPITAL_COMMUNITY): Payer: Self-pay | Admitting: Emergency Medicine

## 2017-05-13 DIAGNOSIS — N179 Acute kidney failure, unspecified: Secondary | ICD-10-CM | POA: Diagnosis present

## 2017-05-13 DIAGNOSIS — G929 Unspecified toxic encephalopathy: Secondary | ICD-10-CM | POA: Diagnosis present

## 2017-05-13 DIAGNOSIS — R4182 Altered mental status, unspecified: Secondary | ICD-10-CM

## 2017-05-13 DIAGNOSIS — Z978 Presence of other specified devices: Secondary | ICD-10-CM

## 2017-05-13 DIAGNOSIS — J690 Pneumonitis due to inhalation of food and vomit: Secondary | ICD-10-CM | POA: Diagnosis present

## 2017-05-13 DIAGNOSIS — F17210 Nicotine dependence, cigarettes, uncomplicated: Secondary | ICD-10-CM | POA: Diagnosis present

## 2017-05-13 DIAGNOSIS — M549 Dorsalgia, unspecified: Secondary | ICD-10-CM | POA: Diagnosis present

## 2017-05-13 DIAGNOSIS — G92 Toxic encephalopathy: Secondary | ICD-10-CM | POA: Diagnosis present

## 2017-05-13 DIAGNOSIS — F1120 Opioid dependence, uncomplicated: Secondary | ICD-10-CM | POA: Diagnosis present

## 2017-05-13 DIAGNOSIS — J9602 Acute respiratory failure with hypercapnia: Secondary | ICD-10-CM | POA: Diagnosis present

## 2017-05-13 DIAGNOSIS — Y92009 Unspecified place in unspecified non-institutional (private) residence as the place of occurrence of the external cause: Secondary | ICD-10-CM

## 2017-05-13 DIAGNOSIS — R110 Nausea: Secondary | ICD-10-CM | POA: Diagnosis not present

## 2017-05-13 DIAGNOSIS — T401X1A Poisoning by heroin, accidental (unintentional), initial encounter: Principal | ICD-10-CM | POA: Diagnosis present

## 2017-05-13 DIAGNOSIS — G8929 Other chronic pain: Secondary | ICD-10-CM | POA: Diagnosis present

## 2017-05-13 DIAGNOSIS — J9601 Acute respiratory failure with hypoxia: Secondary | ICD-10-CM | POA: Diagnosis present

## 2017-05-13 DIAGNOSIS — F1110 Opioid abuse, uncomplicated: Secondary | ICD-10-CM

## 2017-05-13 LAB — I-STAT CHEM 8, ED
BUN: 11 mg/dL (ref 6–20)
CALCIUM ION: 1.16 mmol/L (ref 1.15–1.40)
Chloride: 103 mmol/L (ref 101–111)
Creatinine, Ser: 0.9 mg/dL (ref 0.61–1.24)
Glucose, Bld: 131 mg/dL — ABNORMAL HIGH (ref 65–99)
HCT: 46 % (ref 39.0–52.0)
Hemoglobin: 15.6 g/dL (ref 13.0–17.0)
Potassium: 4.7 mmol/L (ref 3.5–5.1)
Sodium: 139 mmol/L (ref 135–145)
TCO2: 30 mmol/L (ref 22–32)

## 2017-05-13 LAB — I-STAT ARTERIAL BLOOD GAS, ED
Acid-Base Excess: 1 mmol/L (ref 0.0–2.0)
Bicarbonate: 28.6 mmol/L — ABNORMAL HIGH (ref 20.0–28.0)
O2 Saturation: 100 %
PCO2 ART: 55 mmHg — AB (ref 32.0–48.0)
PH ART: 7.32 — AB (ref 7.350–7.450)
TCO2: 30 mmol/L (ref 22–32)
pO2, Arterial: 368 mmHg — ABNORMAL HIGH (ref 83.0–108.0)

## 2017-05-13 LAB — I-STAT CG4 LACTIC ACID, ED: LACTIC ACID, VENOUS: 0.8 mmol/L (ref 0.5–1.9)

## 2017-05-13 LAB — RAPID URINE DRUG SCREEN, HOSP PERFORMED
Amphetamines: NOT DETECTED
BENZODIAZEPINES: POSITIVE — AB
Barbiturates: NOT DETECTED
Cocaine: NOT DETECTED
Opiates: POSITIVE — AB
TETRAHYDROCANNABINOL: POSITIVE — AB

## 2017-05-13 LAB — COMPREHENSIVE METABOLIC PANEL
ALT: 16 U/L — ABNORMAL LOW (ref 17–63)
ANION GAP: 10 (ref 5–15)
AST: 19 U/L (ref 15–41)
Albumin: 4.1 g/dL (ref 3.5–5.0)
Alkaline Phosphatase: 69 U/L (ref 38–126)
BUN: 9 mg/dL (ref 6–20)
CO2: 24 mmol/L (ref 22–32)
Calcium: 8.9 mg/dL (ref 8.9–10.3)
Chloride: 103 mmol/L (ref 101–111)
Creatinine, Ser: 1.01 mg/dL (ref 0.61–1.24)
GFR calc non Af Amer: >60 mL/min (ref >60)
GLUCOSE: 132 mg/dL — AB (ref 65–99)
POTASSIUM: 4.4 mmol/L (ref 3.5–5.1)
SODIUM: 137 mmol/L (ref 135–145)
Total Bilirubin: 0.5 mg/dL (ref 0.3–1.2)
Total Protein: 7.8 g/dL (ref 6.5–8.1)

## 2017-05-13 LAB — CBC
HEMATOCRIT: 44.7 % (ref 39.0–52.0)
HEMOGLOBIN: 15.3 g/dL (ref 13.0–17.0)
MCH: 30.4 pg (ref 26.0–34.0)
MCHC: 34.2 g/dL (ref 30.0–36.0)
MCV: 88.7 fL (ref 78.0–100.0)
Platelets: 211 10*3/uL (ref 150–400)
RBC: 5.04 MIL/uL (ref 4.22–5.81)
RDW: 13.2 % (ref 11.5–15.5)
WBC: 11.2 10*3/uL — ABNORMAL HIGH (ref 4.0–10.5)

## 2017-05-13 LAB — GLUCOSE, CAPILLARY: GLUCOSE-CAPILLARY: 151 mg/dL — AB (ref 65–99)

## 2017-05-13 LAB — I-STAT TROPONIN, ED: Troponin i, poc: 0 ng/mL (ref 0.00–0.08)

## 2017-05-13 LAB — SALICYLATE LEVEL: Salicylate Lvl: <7 mg/dL (ref 2.8–30.0)

## 2017-05-13 LAB — ETHANOL: Alcohol, Ethyl (B): <5 mg/dL (ref <5)

## 2017-05-13 LAB — TRIGLYCERIDES: TRIGLYCERIDES: 234 mg/dL — AB (ref <150)

## 2017-05-13 LAB — ACETAMINOPHEN LEVEL: Acetaminophen (Tylenol), Serum: <10 ug/mL — ABNORMAL LOW (ref 10–30)

## 2017-05-13 MED ORDER — ACETAMINOPHEN 325 MG PO TABS: 650.0000 mg | ORAL_TABLET | ORAL | Status: DC | PRN

## 2017-05-13 MED ORDER — SODIUM CHLORIDE 0.9 % IV SOLN
INTRAVENOUS | Status: DC
  Administered 2017-05-13 – 2017-05-17 (×6): via INTRAVENOUS

## 2017-05-13 MED ORDER — ENOXAPARIN SODIUM 40 MG/0.4ML ~~LOC~~ SOLN
40.0000 mg | Freq: Every day | SUBCUTANEOUS | Status: DC
  Administered 2017-05-14: 40 mg via SUBCUTANEOUS
  Filled 2017-05-13 (×3): qty 0.4

## 2017-05-13 MED ORDER — LACTATED RINGERS IV BOLUS (SEPSIS)
1000.0000 mL | Freq: Once | INTRAVENOUS | Status: AC
  Administered 2017-05-13: 1000 mL via INTRAVENOUS

## 2017-05-13 MED ORDER — PROPOFOL 1000 MG/100ML IV EMUL
0.0000 ug/kg/min | INTRAVENOUS | Status: DC
  Administered 2017-05-13: 10 ug/kg/min via INTRAVENOUS

## 2017-05-13 MED ORDER — NALOXONE HCL 2 MG/2ML IJ SOSY
PREFILLED_SYRINGE | INTRAMUSCULAR | Status: AC
  Filled 2017-05-13: qty 2

## 2017-05-13 MED ORDER — FENTANYL CITRATE (PF) 100 MCG/2ML IJ SOLN
25.0000 ug | INTRAMUSCULAR | Status: DC | PRN
  Administered 2017-05-14 (×3): 100 ug via INTRAVENOUS
  Filled 2017-05-13 (×3): qty 2

## 2017-05-13 MED ORDER — SODIUM CHLORIDE 0.9 % IV SOLN: 250.0000 mL | INTRAVENOUS | Status: DC | PRN

## 2017-05-13 MED ORDER — PROPOFOL 1000 MG/100ML IV EMUL
INTRAVENOUS | Status: AC
  Filled 2017-05-13: qty 100

## 2017-05-13 MED ORDER — PROPOFOL 1000 MG/100ML IV EMUL
0.0000 ug/kg/min | INTRAVENOUS | Status: DC
  Administered 2017-05-13: 20 ug/kg/min via INTRAVENOUS
  Administered 2017-05-14: 50 ug/kg/min via INTRAVENOUS
  Administered 2017-05-14: 40 ug/kg/min via INTRAVENOUS
  Administered 2017-05-14: 50 ug/kg/min via INTRAVENOUS
  Filled 2017-05-13 (×3): qty 100

## 2017-05-13 MED ORDER — PANTOPRAZOLE SODIUM 40 MG IV SOLR
40.0000 mg | Freq: Every day | INTRAVENOUS | Status: DC
  Administered 2017-05-14: 40 mg via INTRAVENOUS
  Filled 2017-05-13: qty 40

## 2017-05-13 NOTE — ED Notes (Signed)
Intensivist at bedside. Paused propofol infusion and decreased vent settings to 7 breaths/min. Pt only had one respiration on his own. Pt unable to follow verbal commands, however was moving all extremities. Propofol infusion resumed and vent settings set back to previous rate.

## 2017-05-13 NOTE — ED Notes (Signed)
Pt back from CT, will continue to monitor, 2 family at bedside
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
## 2017-05-13 NOTE — ED Notes (Signed)
Pt to CT with 2 RN and RT

## 2017-05-13 NOTE — ED Notes (Signed)
2mg  narcan given IV on patient arrival to TRA C

## 2017-05-13 NOTE — H&P (Addendum)
PULMONARY / CRITICAL CARE MEDICINE   Name: Omar Castillo MRN: [id number] DOB: Nov 28, 1965    ADMISSION DATE:  05/13/2017  CHIEF COMPLAINT:  AMS  HISTORY OF PRESENT ILLNESS:   At the time my examination the patient is intubated and sedated and thusly unable to provide history of present illness, review of systems, past medical history, family history, social history, home medications, or allergies. Histories are obtained from family, medical providers, with a medical record.  51 year old male patient brought in by EMS after family called 911 for altered mental status. Niece and sister report patient has a history of snorting heroin. He was in her normal state of health before he went out ostensibly to obtain heroin came back "high" and progressively became less responsive. His sister reports that she was with him as he became less responsive but that he never stopped breathing and she was checking for that.  PAST MEDICAL HISTORY :  He  has a past medical history of Back pain, chronic.  PAST SURGICAL HISTORY: He  has a past surgical history that includes Back surgery.  No Known Allergies  No current facility-administered medications on file prior to encounter.    No current outpatient prescriptions on file prior to encounter.    FAMILY HISTORY:  His has no family status information on file.    SOCIAL HISTORY: He  reports that he has been smoking Cigarettes.  He has been smoking about 0.50 packs per day. He does not have any smokeless tobacco history on file. He reports that he uses drugs. He reports that he does not drink alcohol.  VITAL SIGNS: BP 121/84   Pulse 77   Temp (!) 97 F (36.1 C) (Tympanic)   Resp 14   Wt 198 lb 6.6 oz (90 kg)   SpO2 99%   BMI 31.08 kg/m      VENTILATOR SETTINGS: Vent Mode: PRVC FiO2 (%):  [40 %-100 %] 40 % Set Rate:  [14 bmp] 14 bmp Vt Set:  [500 mL] 500 mL PEEP:  [5 cmH20] 5 cmH20 Plateau Pressure:  [15 cmH20] 15 cmH20  INTAKE /  OUTPUT: No intake/output data recorded.  PHYSICAL EXAMINATION: General:  Appears nourished, stated age, not chronically ill Neuro:  Moves all extremities to noxious stimuli HEENT:  PERRLA EOMI ATNC cervical collar Cardiovascular:  Regular rate and rhythm no murmur good capillary refill no edema Lungs:  Clear to auscultation no wheezing rales rhonchi no spontaneous breathing on the ventilator Abdomen:  Positive bowel sounds soft nontender nondistended Musculoskeletal:  No red warm swollen tender deformed joints Skin:  Warm dry no rash  LABS:  BMET  Recent Labs Lab 05/13/17 1950 05/13/17 2001  NA 137 139  K 4.4 4.7  CL 103 103  CO2 24  --   BUN 9 11  CREATININE 1.01 0.90  GLUCOSE 132* 131*    Electrolytes  Recent Labs Lab 05/13/17 1950  CALCIUM 8.9    CBC  Recent Labs Lab 05/13/17 1950 05/13/17 2001  WBC 11.2*  --   HGB 15.3 15.6  HCT 44.7 46.0  PLT 211  --     Coag's No results for input(s): APTT, INR in the last 168 hours.  Sepsis Markers  Recent Labs Lab 05/13/17 2001  LATICACIDVEN 0.80    ABG  Recent Labs Lab 05/13/17 1959  PHART 7.320*  PCO2ART 55.0*  PO2ART 368.0*    Liver Enzymes  Recent Labs Lab 05/13/17 1950  AST 19  ALT 16*  ALKPHOS 69  BILITOT 0.5  ALBUMIN 4.1    Cardiac Enzymes No results for input(s): TROPONINI, PROBNP in the last 168 hours.  Glucose No results for input(s): GLUCAP in the last 168 hours.  Imaging Ct Head Wo Contrast  Result Date: 05/13/2017 CLINICAL DATA:  Unresponsive patient possible overdose EXAM: CT HEAD WITHOUT CONTRAST CT CERVICAL SPINE WITHOUT CONTRAST TECHNIQUE: Multidetector CT imaging of the head and cervical spine was performed following the standard protocol without intravenous contrast. Multiplanar CT image reconstructions of the cervical spine were also generated. COMPARISON:  03/09/2016 FINDINGS: CT HEAD FINDINGS Brain: No acute territorial infarction, hemorrhage or intracranial mass  is seen. The ventricles are nonenlarged. Mild cortical atrophy. Vascular: No hyperdense vessels.  No unexpected calcification. Skull: No fracture or suspicious focal lesion Sinuses/Orbits: Fluid level in the left maxillary sinus with mucosal thickening. Mucosal thickening in the ethmoid sinuses. No acute orbital abnormality. Other: None CT CERVICAL SPINE FINDINGS Alignment: Straightening of the cervical spine. No subluxation. Facet alignment is within normal limits. Skull base and vertebrae: No acute fracture. No primary bone lesion or focal pathologic process. Soft tissues and spinal canal: No prevertebral fluid or swelling. No visible canal hematoma. Disc levels: Moderate degenerative changes at C5-C6. Mild changes at C4-C5 and C6-C7. Bilateral foraminal narrowing at C5-C6 and C6-C7. Upper chest: Negative. Other: None IMPRESSION: 1. No CT evidence for acute intracranial abnormality. 2. Straightening of the cervical spine. No acute fracture or malalignment 3. Sinusitis Electronically Signed   By: Jasmine PangKim  Fujinaga M.D.   On: 05/13/2017 20:34   Ct Cervical Spine Wo Contrast  Result Date: 05/13/2017 CLINICAL DATA:  Unresponsive patient possible overdose EXAM: CT HEAD WITHOUT CONTRAST CT CERVICAL SPINE WITHOUT CONTRAST TECHNIQUE: Multidetector CT imaging of the head and cervical spine was performed following the standard protocol without intravenous contrast. Multiplanar CT image reconstructions of the cervical spine were also generated. COMPARISON:  03/09/2016 FINDINGS: CT HEAD FINDINGS Brain: No acute territorial infarction, hemorrhage or intracranial mass is seen. The ventricles are nonenlarged. Mild cortical atrophy. Vascular: No hyperdense vessels.  No unexpected calcification. Skull: No fracture or suspicious focal lesion Sinuses/Orbits: Fluid level in the left maxillary sinus with mucosal thickening. Mucosal thickening in the ethmoid sinuses. No acute orbital abnormality. Other: None CT CERVICAL SPINE FINDINGS  Alignment: Straightening of the cervical spine. No subluxation. Facet alignment is within normal limits. Skull base and vertebrae: No acute fracture. No primary bone lesion or focal pathologic process. Soft tissues and spinal canal: No prevertebral fluid or swelling. No visible canal hematoma. Disc levels: Moderate degenerative changes at C5-C6. Mild changes at C4-C5 and C6-C7. Bilateral foraminal narrowing at C5-C6 and C6-C7. Upper chest: Negative. Other: None IMPRESSION: 1. No CT evidence for acute intracranial abnormality. 2. Straightening of the cervical spine. No acute fracture or malalignment 3. Sinusitis Electronically Signed   By: Jasmine PangKim  Fujinaga M.D.   On: 05/13/2017 20:34   Dg Chest Portable 1 View  Result Date: 05/13/2017 CLINICAL DATA:  Status post intubation EXAM: PORTABLE CHEST 1 VIEW COMPARISON:  12/18/2011, 03/23/2009 FINDINGS: Endotracheal tube tip is about 2.7 cm superior to the carina. Esophageal tube tip is below the diaphragm but not included. Right infrahilar atelectasis or small infiltrate. No effusion. Normal heart size. No pneumothorax. IMPRESSION: 1. Endotracheal tube tip about 2.7 cm superior to the carina 2. Mild right infrahilar atelectasis or mild infiltrate Electronically Signed   By: Jasmine PangKim  Fujinaga M.D.   On: 05/13/2017 20:16     STUDIES:  CT head and CT C-spine no acute  pathology Chest x-ray right lower lobe infiltrate  CULTURES: None  ANTIBIOTICS: None  SIGNIFICANT EVENTS: 9/1 admitted to the medical ICU from ED  LINES/TUBES: Intubated 9/1  DISCUSSION: Based on history obtained from family, physical exam, labs, imaging it appears [place] a 51 year old male who has a history of opiate abuse who went out extensively to obtain opiates came back intoxicated and became progressively more intoxicated necessitating intubation.  ASSESSMENT / PLAN:  PULMONARY A: Intubated for airway protection Acute hypercapnic respiratory failure P:   LTVV Daily awakening and  spontaneous breathing trial  CARDIOVASCULAR A:  No acute problems P:  Telemetry  RENAL A:   Acute kidney injury P:   Received IV fluids in ED Monitor creatinine  GASTROINTESTINAL A:   GI prophylaxis P:   Protonix  HEMATOLOGIC A:   Leukocytosis P:  Monitor CBC  INFECTIOUS A:   Right lower lobe infiltrate likely aspiration less likely pneumonia P:   Monitor for signs of infection  ENDOCRINE A:   No active issues   P:   Monitor glucose on BMP  NEUROLOGIC A:   Toxic encephalopathy secondary to suspected Overdose P:   RASS goal: -1 Propofol for sedation fentanyl for pain  FAMILY  - Updates: At bedside in ED  - Inter-disciplinary family meet or Palliative Care meeting due by:  05/20/17   Upon my evaluation, this patient had a high probability of imminent or life-threatening deterioration due to inability to protect airway  high complexity decision making to assess, manipulate, and support vital organ system failure including invasive mechanical ventilation  I have personally provided 35 minutes minutes of critical care time exclusive of time spent on separately billable procedures and education. Time includes review and summation of previous medical record, laboratory data, radiology results, independent review of chest x-ray, coordination of care with respiratory therapy and ED nurse, and monitoring for potential decompensation. Interventions were performed as documented above.  Condition: Critical Prognosis: Fair Code Status: Full  Caro Laroche, MD Critical Care Medicine [facility] Pager: [phone]  05/13/2017, 10:50 PM

## 2017-05-13 NOTE — ED Provider Notes (Signed)
[facility]-EMERGENCY DEPT Provider Note   CSN: [id number] Arrival date & time: 05/13/17  1949     History   Chief Complaint Chief Complaint  Patient presents with  . Altered Mental Status  . Drug Overdose    HPI Omar Castillo is a 51 y.o. male.  This is a 51 year old male with a known PMH who presents unresponsive after being found at home where he lives with his parents.  Mother states she heard a thud upstairs and upon finding the patient upstairs in his room he was unresponsive and not breathing. There was suspected heroin abuse. EMS arrived and GCS 3. No gag reflex. Was immediately started being ventilated via BVM. Initial glucose 114. EMS gave Narcan 2 mg IV with no change.  Mother attempted Narcan nasal spray x2 as well.  Unable to obtain history. According to Mother patient has history of narcotic abuse, also occasionally abuses Xanax.      Past Medical History:  Diagnosis Date  . Back pain, chronic     Patient Active Problem List   Diagnosis Date Noted  . Toxic encephalopathy 05/13/2017  . LOW BACK PAIN 08/25/2006  . ANXIETY 07/25/2006  . DEPENDENCE, COCAINE, UNSPECIFIED 07/25/2006  . TOBACCO ABUSE 07/25/2006  . DEPRESSION 07/25/2006  . DEGENERATIVE DISC DISEASE 07/25/2006  . BACK PAIN 07/25/2006    Past Surgical History:  Procedure Laterality Date  . BACK SURGERY         Home Medications    Prior to Admission medications   Medication Sig Start Date End Date Taking? Authorizing Provider  oxyCODONE (ROXICODONE) 15 MG immediate release tablet Take 15 mg by mouth 3 (three) times daily as needed for pain.    [provider]    Family History History reviewed. No pertinent family history.  Social History Social History  Substance Use Topics  . Smoking status: Current Every Day Smoker    Packs/day: 0.50    Types: Cigarettes  . Smokeless tobacco: Not on file  . Alcohol use No     Allergies   Patient has no known allergies.   Review of  Systems Review of Systems  Unable to perform ROS: Patient unresponsive     Physical Exam Updated Vital Signs BP 121/84   Pulse 77   Temp (!) 97 F (36.1 C) (Tympanic)   Resp 14   Wt 90 kg (198 lb 6.6 oz)   SpO2 99%   BMI 31.08 kg/m   Physical Exam  Constitutional: He appears well-developed and well-nourished. He appears toxic. Cervical collar in place.  HENT:  Head: Normocephalic and atraumatic.  Nose: Nose normal.  Mouth/Throat: Mucous membranes are normal.  Eyes: Pupils are equal, round, and reactive to light. Conjunctivae are normal.  Neck: Neck supple.  Cardiovascular: Normal rate and regular rhythm.   No murmur heard. Pulmonary/Chest: Breath sounds normal. Apnea noted. He has no rales.  Abdominal: Soft. He exhibits no distension.  Musculoskeletal: He exhibits no edema.  Neurological: He is unresponsive.  Skin: Skin is warm and dry. No rash noted.  Psychiatric: He has a normal mood and affect.  Nursing note and vitals reviewed.  ED Treatments / Results  Labs (all labs ordered are listed, but only abnormal results are displayed) Labs Reviewed  COMPREHENSIVE METABOLIC PANEL - Abnormal; Notable for the following:       Result Value   Glucose, Bld 132 (*)    ALT 16 (*)    All other components within normal limits  CBC - Abnormal;  Notable for the following:    WBC 11.2 (*)    All other components within normal limits  ACETAMINOPHEN LEVEL - Abnormal; Notable for the following:    Acetaminophen (Tylenol), Serum <10 (*)    All other components within normal limits  TRIGLYCERIDES - Abnormal; Notable for the following:    Triglycerides 234 (*)    All other components within normal limits  I-STAT CHEM 8, ED - Abnormal; Notable for the following:    Glucose, Bld 131 (*)    All other components within normal limits  I-STAT ARTERIAL BLOOD GAS, ED - Abnormal; Notable for the following:    pH, Arterial 7.320 (*)    pCO2 arterial 55.0 (*)    pO2, Arterial 368.0 (*)     Bicarbonate 28.6 (*)    All other components within normal limits  ETHANOL  SALICYLATE LEVEL  RAPID URINE DRUG SCREEN, HOSP PERFORMED  BLOOD GAS, ARTERIAL  I-STAT TROPONIN, ED  I-STAT CG4 LACTIC ACID, ED    EKG  EKG Interpretation  Date/Time:  Saturday May 13 2017 19:51:42 EDT Ventricular Rate:  113 PR Interval:    QRS Duration: 78 QT Interval:  340 QTC Calculation: 467 R Axis:   57 Text Interpretation:  Sinus tachycardia Confirmed by Cathren LaineSteinl, Kevin ([id number]) on 05/13/2017 9:34:37 PM       Radiology Ct Head Wo Contrast  Result Date: 05/13/2017 CLINICAL DATA:  Unresponsive patient possible overdose EXAM: CT HEAD WITHOUT CONTRAST CT CERVICAL SPINE WITHOUT CONTRAST TECHNIQUE: Multidetector CT imaging of the head and cervical spine was performed following the standard protocol without intravenous contrast. Multiplanar CT image reconstructions of the cervical spine were also generated. COMPARISON:  03/09/2016 FINDINGS: CT HEAD FINDINGS Brain: No acute territorial infarction, hemorrhage or intracranial mass is seen. The ventricles are nonenlarged. Mild cortical atrophy. Vascular: No hyperdense vessels.  No unexpected calcification. Skull: No fracture or suspicious focal lesion Sinuses/Orbits: Fluid level in the left maxillary sinus with mucosal thickening. Mucosal thickening in the ethmoid sinuses. No acute orbital abnormality. Other: None CT CERVICAL SPINE FINDINGS Alignment: Straightening of the cervical spine. No subluxation. Facet alignment is within normal limits. Skull base and vertebrae: No acute fracture. No primary bone lesion or focal pathologic process. Soft tissues and spinal canal: No prevertebral fluid or swelling. No visible canal hematoma. Disc levels: Moderate degenerative changes at C5-C6. Mild changes at C4-C5 and C6-C7. Bilateral foraminal narrowing at C5-C6 and C6-C7. Upper chest: Negative. Other: None IMPRESSION: 1. No CT evidence for acute intracranial abnormality. 2.  Straightening of the cervical spine. No acute fracture or malalignment 3. Sinusitis Electronically Signed   By: Jasmine PangKim  Fujinaga M.D.   On: 05/13/2017 20:34   Ct Cervical Spine Wo Contrast  Result Date: 05/13/2017 CLINICAL DATA:  Unresponsive patient possible overdose EXAM: CT HEAD WITHOUT CONTRAST CT CERVICAL SPINE WITHOUT CONTRAST TECHNIQUE: Multidetector CT imaging of the head and cervical spine was performed following the standard protocol without intravenous contrast. Multiplanar CT image reconstructions of the cervical spine were also generated. COMPARISON:  03/09/2016 FINDINGS: CT HEAD FINDINGS Brain: No acute territorial infarction, hemorrhage or intracranial mass is seen. The ventricles are nonenlarged. Mild cortical atrophy. Vascular: No hyperdense vessels.  No unexpected calcification. Skull: No fracture or suspicious focal lesion Sinuses/Orbits: Fluid level in the left maxillary sinus with mucosal thickening. Mucosal thickening in the ethmoid sinuses. No acute orbital abnormality. Other: None CT CERVICAL SPINE FINDINGS Alignment: Straightening of the cervical spine. No subluxation. Facet alignment is within normal limits. Skull base and  vertebrae: No acute fracture. No primary bone lesion or focal pathologic process. Soft tissues and spinal canal: No prevertebral fluid or swelling. No visible canal hematoma. Disc levels: Moderate degenerative changes at C5-C6. Mild changes at C4-C5 and C6-C7. Bilateral foraminal narrowing at C5-C6 and C6-C7. Upper chest: Negative. Other: None IMPRESSION: 1. No CT evidence for acute intracranial abnormality. 2. Straightening of the cervical spine. No acute fracture or malalignment 3. Sinusitis Electronically Signed   By: Jasmine Pang M.D.   On: 05/13/2017 20:34   Dg Chest Portable 1 View  Result Date: 05/13/2017 CLINICAL DATA:  Status post intubation EXAM: PORTABLE CHEST 1 VIEW COMPARISON:  12/18/2011, 03/23/2009 FINDINGS: Endotracheal tube tip is about 2.7 cm  superior to the carina. Esophageal tube tip is below the diaphragm but not included. Right infrahilar atelectasis or small infiltrate. No effusion. Normal heart size. No pneumothorax. IMPRESSION: 1. Endotracheal tube tip about 2.7 cm superior to the carina 2. Mild right infrahilar atelectasis or mild infiltrate Electronically Signed   By: Jasmine Pang M.D.   On: 05/13/2017 20:16    Procedures Procedure Name: Intubation Date/Time: 05/13/2017 10:45 PM Performed by: Shaune Pollack Pre-anesthesia Checklist: Patient identified, Emergency Drugs available, Suction available and Patient being monitored Oxygen Delivery Method: Ambu bag Preoxygenation: Pre-oxygenation with 100% oxygen Laryngoscope Size: Glidescope Grade View: Grade I Tube size: 7.5 mm Number of attempts: 1 Airway Equipment and Method: Rigid stylet Placement Confirmation: ETT inserted through vocal cords under direct vision,  Positive ETCO2,  CO2 detector and Breath sounds checked- equal and bilateral Tube secured with: ETT holder Difficulty Due To: Difficulty was unanticipated      (including critical care time)  Medications Ordered in ED Medications  propofol (DIPRIVAN) 1000 MG/100ML infusion (not administered)  0.9 %  sodium chloride infusion ( Intravenous Stopped 05/13/17 2103)  naloxone (NARCAN) 2 MG/2ML injection (not administered)  propofol (DIPRIVAN) 1000 MG/100ML infusion (20 mcg/kg/min  90 kg Intravenous New Bag/Given 05/13/17 2146)     Initial Impression / Assessment and Plan / ED Course  I have reviewed the triage vital signs and the nursing notes.  Pertinent labs & imaging results that were available during my care of the patient were reviewed by me and considered in my medical decision making (see chart for details).     This is a 51 year old male with a known PMH who presents unresponsive after being found at home where he lives with his parents.  C-collar placed by EMS, actively being ventilated via BVM on  arrival. Patient had no gag reflex, OPA in place along with nasal trumpet.  GCS 3 on arrival.  Bilateral large bore IV access obtained. Patient given 2 mg IV Narcan with no response. Intubation via glidoscope performed with manual in-line stabilization.  Intubation performed without complication, see procedure note above for details.  Lactate 0.8.  Initial ABG showed pH 7.32. CMP, CBC, Ethanol, acetaminophen, salicylates all obtained and largely unremarkable. CT head and C-spine negative for acute pathology.  Urine drug screen obtained and pending. Discussed care of patient at bedside with family.  Mother states patient has had long history of opioid abuse.  He is still prescribed roxicodone as outpatient.  She states she has been aware of his occasional heroin abuse as well as occasional Xanax abuse in the past. Confirmed patient to be full code after discussion of goals of care at bedside.  Critical care team consulted for admission. All questions answered at bedside with family.  Final Clinical Impressions(s) / ED Diagnoses  Final diagnoses:  Altered mental status, unspecified altered mental status type  Endotracheally intubated  Opiate abuse, continuous   New Prescriptions New Prescriptions   No medications on file     Shaune Pollack, MD 05/13/17 1610    Cathren Laine, MD 05/13/17 [phone]

## 2017-05-13 NOTE — ED Notes (Signed)
Pts family being updated by Eliot FordBriggs, MD ER Resident

## 2017-05-13 NOTE — ED Notes (Addendum)
Pt presents by EMS with NPA and OPA, NRB to aid in respirations; pt unresponsive at this time;  Per EMS - family heard thud in back bedroom and found pt unresponsive, per family pt is a known opiate abuser, sister noted that pt bought unknown amount of heroin at [id number] tonight, EMS got call at 1845 for unresponsive; family reports patient stumbled to back bedroom and heard a thud and found him lying in floor unresponsive; 2mg  narcan given intranasally by sister prior to EMS arrival; pt found by EMS with agonal resp and pinpoint pupils, EMS gave 0.5mg  IV on scene and an additional 0.5mg  naracan enroute to ER- with no improvement; NPA in R nare with NRB at 99% O2 then transferred to OPA and BVM; c-collar placed for precautions, abrasion noted to top of head, bleeding controlled; 3mm pupils sluggish after narcan; CBG 114; per EMS- 4mg  narcan given total prehospital

## 2017-05-14 DIAGNOSIS — G92 Toxic encephalopathy: Secondary | ICD-10-CM

## 2017-05-14 DIAGNOSIS — J9602 Acute respiratory failure with hypercapnia: Secondary | ICD-10-CM

## 2017-05-14 DIAGNOSIS — N179 Acute kidney failure, unspecified: Secondary | ICD-10-CM

## 2017-05-14 LAB — CBC
HCT: 39.5 % (ref 39.0–52.0)
HEMOGLOBIN: 13.5 g/dL (ref 13.0–17.0)
MCH: 29.9 pg (ref 26.0–34.0)
MCHC: 34.2 g/dL (ref 30.0–36.0)
MCV: 87.6 fL (ref 78.0–100.0)
PLATELETS: 180 10*3/uL (ref 150–400)
RBC: 4.51 MIL/uL (ref 4.22–5.81)
RDW: 13.3 % (ref 11.5–15.5)
WBC: 9.2 10*3/uL (ref 4.0–10.5)

## 2017-05-14 LAB — BASIC METABOLIC PANEL
Anion gap: 5 (ref 5–15)
BUN: 8 mg/dL (ref 6–20)
CALCIUM: 8.7 mg/dL — AB (ref 8.9–10.3)
CO2: 27 mmol/L (ref 22–32)
Chloride: 106 mmol/L (ref 101–111)
Creatinine, Ser: 0.78 mg/dL (ref 0.61–1.24)
GLUCOSE: 140 mg/dL — AB (ref 65–99)
Potassium: 3.9 mmol/L (ref 3.5–5.1)
Sodium: 138 mmol/L (ref 135–145)

## 2017-05-14 LAB — MRSA PCR SCREENING: MRSA by PCR: NEGATIVE

## 2017-05-14 LAB — HIV ANTIBODY (ROUTINE TESTING W REFLEX): HIV Screen 4th Generation wRfx: NONREACTIVE

## 2017-05-14 MED ORDER — CHLORHEXIDINE GLUCONATE 0.12% ORAL RINSE (MEDLINE KIT)
15.0000 mL | Freq: Two times a day (BID) | OROMUCOSAL | Status: DC
  Administered 2017-05-14: 15 mL via OROMUCOSAL

## 2017-05-14 MED ORDER — FENTANYL CITRATE (PF) 100 MCG/2ML IJ SOLN
12.5000 ug | INTRAMUSCULAR | Status: DC | PRN
  Administered 2017-05-15 (×4): 25 ug via INTRAVENOUS
  Administered 2017-05-15: 12.5 ug via INTRAVENOUS
  Administered 2017-05-15 – 2017-05-17 (×10): 25 ug via INTRAVENOUS
  Filled 2017-05-14 (×15): qty 2

## 2017-05-14 MED ORDER — ORAL CARE MOUTH RINSE
15.0000 mL | Freq: Two times a day (BID) | OROMUCOSAL | Status: DC
  Administered 2017-05-16: 15 mL via OROMUCOSAL

## 2017-05-14 MED ORDER — ORAL CARE MOUTH RINSE
15.0000 mL | Freq: Four times a day (QID) | OROMUCOSAL | Status: DC
  Administered 2017-05-14: 15 mL via OROMUCOSAL

## 2017-05-14 MED ORDER — CHLORHEXIDINE GLUCONATE 0.12 % MT SOLN
15.0000 mL | Freq: Two times a day (BID) | OROMUCOSAL | Status: DC
  Administered 2017-05-15 – 2017-05-16 (×3): 15 mL via OROMUCOSAL
  Filled 2017-05-14 (×5): qty 15

## 2017-05-14 NOTE — Progress Notes (Signed)
PULMONARY / CRITICAL CARE MEDICINE   Name: Omar Castillo MRN: [id number] DOB: 01-13-1966    ADMISSION DATE:  05/13/2017 CONSULTATION DATE:  05/13/2017  REFERRING MD:  Dr. Eliot Ford  CHIEF COMPLAINT:  Found down  HISTORY OF PRESENT ILLNESS:   51 year old male with a past medical history significant for narcotic abuse was apparently snorting heroin a day ago and then was found by his niece and sister with severe encephalopathy/unresponsiveness. Intubated for airway protection.  SUBJECTIVE:  No acute events  VITAL SIGNS: BP 118/78   Pulse 83   Temp 98.8 F (37.1 C)   Resp 14   Ht 5\' 10"  (1.778 m)   Wt 81.2 kg (179 lb 0.2 oz)   SpO2 96%   BMI 25.69 kg/m   HEMODYNAMICS:    VENTILATOR SETTINGS: Vent Mode: PRVC FiO2 (%):  [40 %-100 %] 40 % Set Rate:  [14 bmp] 14 bmp Vt Set:  [500 mL] 500 mL PEEP:  [5 cmH20] 5 cmH20 Plateau Pressure:  [14 cmH20-16 cmH20] 14 cmH20  INTAKE / OUTPUT: I/O last 3 completed shifts: In: 2216.7 [I.V.:1216.7; IV Piggyback:1000] Out: 390 [Urine:390]  PHYSICAL EXAMINATION: General:  In bed on vent HENT: NCAT ETT in place PULM: CTA B, vent supported breathing CV: RRR, no mgr GI: BS+, soft, nontender MSK: normal bulk and tone Neuro: Will wake up to voice, interactive, follows commands    LABS:  BMET  Recent Labs Lab 05/13/17 1950 05/13/17 2001 05/14/17 0234  NA 137 139 138  K 4.4 4.7 3.9  CL 103 103 106  CO2 24  --  27  BUN 9 11 8   CREATININE 1.01 0.90 0.78  GLUCOSE 132* 131* 140*    Electrolytes  Recent Labs Lab 05/13/17 1950 05/14/17 0234  CALCIUM 8.9 8.7*    CBC  Recent Labs Lab 05/13/17 1950 05/13/17 2001 05/14/17 0234  WBC 11.2*  --  9.2  HGB 15.3 15.6 13.5  HCT 44.7 46.0 39.5  PLT 211  --  180    Coag's No results for input(s): APTT, INR in the last 168 hours.  Sepsis Markers  Recent Labs Lab 05/13/17 2001  LATICACIDVEN 0.80    ABG  Recent Labs Lab 05/13/17 1959  PHART 7.320*  PCO2ART  55.0*  PO2ART 368.0*    Liver Enzymes  Recent Labs Lab 05/13/17 1950  AST 19  ALT 16*  ALKPHOS 69  BILITOT 0.5  ALBUMIN 4.1    Cardiac Enzymes No results for input(s): TROPONINI, PROBNP in the last 168 hours.  Glucose  Recent Labs Lab 05/13/17 2356  GLUCAP 151*    Imaging Ct Head Wo Contrast  Result Date: 05/13/2017 CLINICAL DATA:  Unresponsive patient possible overdose EXAM: CT HEAD WITHOUT CONTRAST CT CERVICAL SPINE WITHOUT CONTRAST TECHNIQUE: Multidetector CT imaging of the head and cervical spine was performed following the standard protocol without intravenous contrast. Multiplanar CT image reconstructions of the cervical spine were also generated. COMPARISON:  03/09/2016 FINDINGS: CT HEAD FINDINGS Brain: No acute territorial infarction, hemorrhage or intracranial mass is seen. The ventricles are nonenlarged. Mild cortical atrophy. Vascular: No hyperdense vessels.  No unexpected calcification. Skull: No fracture or suspicious focal lesion Sinuses/Orbits: Fluid level in the left maxillary sinus with mucosal thickening. Mucosal thickening in the ethmoid sinuses. No acute orbital abnormality. Other: None CT CERVICAL SPINE FINDINGS Alignment: Straightening of the cervical spine. No subluxation. Facet alignment is within normal limits. Skull base and vertebrae: No acute fracture. No primary bone lesion or focal pathologic process. Soft tissues  and spinal canal: No prevertebral fluid or swelling. No visible canal hematoma. Disc levels: Moderate degenerative changes at C5-C6. Mild changes at C4-C5 and C6-C7. Bilateral foraminal narrowing at C5-C6 and C6-C7. Upper chest: Negative. Other: None IMPRESSION: 1. No CT evidence for acute intracranial abnormality. 2. Straightening of the cervical spine. No acute fracture or malalignment 3. Sinusitis Electronically Signed   By: Jasmine Pang M.D.   On: 05/13/2017 20:34   Ct Cervical Spine Wo Contrast  Result Date: 05/13/2017 CLINICAL DATA:   Unresponsive patient possible overdose EXAM: CT HEAD WITHOUT CONTRAST CT CERVICAL SPINE WITHOUT CONTRAST TECHNIQUE: Multidetector CT imaging of the head and cervical spine was performed following the standard protocol without intravenous contrast. Multiplanar CT image reconstructions of the cervical spine were also generated. COMPARISON:  03/09/2016 FINDINGS: CT HEAD FINDINGS Brain: No acute territorial infarction, hemorrhage or intracranial mass is seen. The ventricles are nonenlarged. Mild cortical atrophy. Vascular: No hyperdense vessels.  No unexpected calcification. Skull: No fracture or suspicious focal lesion Sinuses/Orbits: Fluid level in the left maxillary sinus with mucosal thickening. Mucosal thickening in the ethmoid sinuses. No acute orbital abnormality. Other: None CT CERVICAL SPINE FINDINGS Alignment: Straightening of the cervical spine. No subluxation. Facet alignment is within normal limits. Skull base and vertebrae: No acute fracture. No primary bone lesion or focal pathologic process. Soft tissues and spinal canal: No prevertebral fluid or swelling. No visible canal hematoma. Disc levels: Moderate degenerative changes at C5-C6. Mild changes at C4-C5 and C6-C7. Bilateral foraminal narrowing at C5-C6 and C6-C7. Upper chest: Negative. Other: None IMPRESSION: 1. No CT evidence for acute intracranial abnormality. 2. Straightening of the cervical spine. No acute fracture or malalignment 3. Sinusitis Electronically Signed   By: Jasmine Pang M.D.   On: 05/13/2017 20:34   Dg Chest Portable 1 View  Result Date: 05/13/2017 CLINICAL DATA:  Status post intubation EXAM: PORTABLE CHEST 1 VIEW COMPARISON:  12/18/2011, 03/23/2009 FINDINGS: Endotracheal tube tip is about 2.7 cm superior to the carina. Esophageal tube tip is below the diaphragm but not included. Right infrahilar atelectasis or small infiltrate. No effusion. Normal heart size. No pneumothorax. IMPRESSION: 1. Endotracheal tube tip about 2.7 cm  superior to the carina 2. Mild right infrahilar atelectasis or mild infiltrate Electronically Signed   By: Jasmine Pang M.D.   On: 05/13/2017 20:16     STUDIES:  05/13/2017 CT head and neck without acute injury  CULTURES:   ANTIBIOTICS:   SIGNIFICANT EVENTS:   LINES/TUBES:   DISCUSSION: 51 year old male with a past medical history significant for narcotic abuse came in with altered mental status in the setting of a heroin overdose.  ASSESSMENT / PLAN:  PULMONARY A: Acute respiratory failure with hypoxemia secondary to inability to protect airway Aspiration pneumonitis without pneumonia P:   Wean ventilator now, likely extubation Monitor O2 saturation Hold off on IV antibiotics    RENAL A:   No acute issues P:   Monitor BMET and UOP Replace electrolytes as needed   GASTROINTESTINAL A:   No acute issues P:   Maintain nothing by mouth for 4 hours after extubation Advance diet  HEMATOLOGIC A:   No acute issues P:  Monitor for bleeding  INFECTIOUS A:   Aspiration pneumonitis P:   Hold off on antibiotics   NEUROLOGIC A:   Altered mental status, acute encephalopathy due to narcotic overdose: Resolved Concern for cervical spine ligamentous injury, CT scan negative P:   After extubation will need to assess cervical spine for tenderness to  remove soft collar, between now and then maintained soft collar in place    FAMILY  - Updates: None bedside  My critical care time 31 minutes  Heber CarolinaBrent McQuaid, MD [facility] Pager: [phone] Cell: [phone] After 3pm or if no response, call [phone]    05/14/2017, 1:16 PM

## 2017-05-14 NOTE — Procedures (Signed)
Extubation Procedure Note  Patient Details:   Name: Omar Castillo DOB: 01-Oct-1965 MRN: [id number]   Airway Documentation:     Evaluation  O2 sats: stable throughout Complications: No apparent complications Patient did tolerate procedure well. Bilateral Breath Sounds: Clear, Diminished   Yes   Pt extubated per MD order. Pt tolerated well, sats 99% on 4L [place].  RT will continue to monitor.  Ronny FlurryMorgan B Jeffey Janssen 05/14/2017, 1:55 PM

## 2017-05-14 NOTE — Progress Notes (Signed)
SLP Cancellation Note  Patient Details Name: Omar Castillo MRN: [id number] DOB: [birth year]   Cancelled treatment:       Reason Eval/Treat Not Completed: Other (comment). Post extubation swallow orders received. Pt extubated less than 4hrs ago, just arrived on 5W. Spoke with RN, pt lethargic, will defer to next date.  Rondel BatonMary Beth Gareld Obrecht, [place]MS, CCC-SLP Speech-Language Pathologist [phone]   Arlana LindauMary E Alexandar Weisenberger 05/14/2017, 5:42 PM

## 2017-05-15 ENCOUNTER — Encounter (HOSPITAL_COMMUNITY): Payer: Self-pay | Admitting: General Practice

## 2017-05-15 LAB — CBC WITH DIFFERENTIAL/PLATELET
BASOS ABS: 0 10*3/uL (ref 0.0–0.1)
BASOS PCT: 0 %
EOS PCT: 0 %
Eosinophils Absolute: 0 10*3/uL (ref 0.0–0.7)
HCT: 41.3 % (ref 39.0–52.0)
Hemoglobin: 14 g/dL (ref 13.0–17.0)
Lymphocytes Relative: 10 %
Lymphs Abs: 1.1 10*3/uL (ref 0.7–4.0)
MCH: 30.2 pg (ref 26.0–34.0)
MCHC: 33.9 g/dL (ref 30.0–36.0)
MCV: 89 fL (ref 78.0–100.0)
MONO ABS: 0.4 10*3/uL (ref 0.1–1.0)
Monocytes Relative: 3 %
Neutro Abs: 8.9 10*3/uL — ABNORMAL HIGH (ref 1.7–7.7)
Neutrophils Relative %: 87 %
PLATELETS: 184 10*3/uL (ref 150–400)
RBC: 4.64 MIL/uL (ref 4.22–5.81)
RDW: 13.3 % (ref 11.5–15.5)
WBC: 10.3 10*3/uL (ref 4.0–10.5)

## 2017-05-15 LAB — BASIC METABOLIC PANEL
ANION GAP: 9 (ref 5–15)
BUN: 7 mg/dL (ref 6–20)
CALCIUM: 8.9 mg/dL (ref 8.9–10.3)
CO2: 27 mmol/L (ref 22–32)
CREATININE: 0.82 mg/dL (ref 0.61–1.24)
Chloride: 105 mmol/L (ref 101–111)
Glucose, Bld: 164 mg/dL — ABNORMAL HIGH (ref 65–99)
Potassium: 3.5 mmol/L (ref 3.5–5.1)
SODIUM: 141 mmol/L (ref 135–145)

## 2017-05-15 LAB — LIPASE, BLOOD: LIPASE: 25 U/L (ref 11–51)

## 2017-05-15 LAB — AMYLASE: AMYLASE: 44 U/L (ref 28–100)

## 2017-05-15 MED ORDER — HYDROXYZINE HCL 25 MG PO TABS
25.0000 mg | ORAL_TABLET | Freq: Four times a day (QID) | ORAL | Status: DC | PRN
  Administered 2017-05-16: 25 mg via ORAL
  Filled 2017-05-15: qty 1

## 2017-05-15 MED ORDER — ONDANSETRON HCL 4 MG/2ML IJ SOLN
4.0000 mg | Freq: Once | INTRAMUSCULAR | Status: AC
  Administered 2017-05-15: 4 mg via INTRAVENOUS
  Filled 2017-05-15: qty 2

## 2017-05-15 MED ORDER — LOPERAMIDE HCL 2 MG PO CAPS: 2.0000 mg | ORAL_CAPSULE | ORAL | Status: DC | PRN

## 2017-05-15 MED ORDER — METHOCARBAMOL 500 MG PO TABS
500.0000 mg | ORAL_TABLET | Freq: Three times a day (TID) | ORAL | Status: DC | PRN
  Administered 2017-05-15 – 2017-05-16 (×3): 500 mg via ORAL
  Filled 2017-05-15 (×4): qty 1

## 2017-05-15 MED ORDER — DICYCLOMINE HCL 20 MG PO TABS: 20.0000 mg | ORAL_TABLET | Freq: Four times a day (QID) | ORAL | Status: DC | PRN

## 2017-05-15 MED ORDER — ONDANSETRON 4 MG PO TBDP
4.0000 mg | ORAL_TABLET | Freq: Four times a day (QID) | ORAL | Status: DC | PRN
  Administered 2017-05-15: 4 mg via ORAL
  Filled 2017-05-15: qty 1

## 2017-05-15 MED ORDER — NAPROXEN 250 MG PO TABS
500.0000 mg | ORAL_TABLET | Freq: Two times a day (BID) | ORAL | Status: DC | PRN
  Administered 2017-05-16: 500 mg via ORAL
  Filled 2017-05-15: qty 2

## 2017-05-15 NOTE — Progress Notes (Signed)
eLink Physician-Brief Progress Note Patient Name: Harlan StainsJohnny L Osias DOB: [birth year] MRN: [id number]   Date of Service  05/15/2017  HPI/Events of Note  Nausea - QTc interval = 0.467 seconds.   eICU Interventions  Will order: 1. Zofran 4 mg IV X 1 now.      Intervention Category Intermediate Interventions: Other:  Lenell AntuSommer,Steven Eugene 05/15/2017, 12:36 AM

## 2017-05-15 NOTE — Progress Notes (Signed)
PROGRESS NOTE    Omar Castillo  UEA:[id number]:[id number] DOB: 07-Oct-1965 DOA: 05/13/2017 PCP: Coralee Ruduran, Michael R, PA-C    Brief Narrative:   51 y/o that presented to the hospital after heroin OD.  Assessment & Plan:   Active Problems:   Toxic encephalopathy - resolving  OD heroin - Place on clonidine withdrawal order set - supportive therapy    AKI (acute kidney injury) (HCC) - resolved.    Acute hypercapnic respiratory failure (HCC) - resolved and was secondary to opiod   DVT prophylaxis: Lovenox Code Status: Full Family Communication: none at bedside Disposition Plan: d/c in the next 1-2 days   Consultants:   None   Procedures: none   Antimicrobials: None   Subjective: Pt reporting some withdrawal symptoms  Objective: Vitals:   05/14/17 0805 05/14/17 1823 05/15/17 0500 05/15/17 0658  BP: 118/78 (!) 123/55  127/60  Pulse: 83 91  73  Resp: 14 20  20   Temp:  99.8 F (37.7 C)  98.9 F (37.2 C)  TempSrc:  Oral  Oral  SpO2:  95%  96%  Weight:   82.1 kg (181 lb)   Height:       No intake or output data in the 24 hours ending 05/15/17 1406 Filed Weights   05/13/17 2340 05/14/17 0500 05/15/17 0500  Weight: 78.8 kg (173 lb 11.6 oz) 81.2 kg (179 lb 0.2 oz) 82.1 kg (181 lb)    Examination:  General exam: Appears calm and comfortable, in nad. Respiratory system: Clear to auscultation. Respiratory effort normal. No wheezes Cardiovascular system: no cyanosis Gastrointestinal system: Abdomen is nondistended, soft and nontender. No organomegaly or masses felt. Normal bowel sounds heard. Central nervous system: Alert and oriented. No focal neurological deficits. Extremities: Symmetric 5 x 5 power. Skin: No rashes, lesions or ulcers, on limited exam. Psychiatry:Mood & affect appropriate.   Data Reviewed: I have personally reviewed following labs and imaging studies  CBC:  Recent Labs Lab 05/13/17 1950 05/13/17 2001 05/14/17 0234 05/15/17 0324  WBC 11.2*   --  9.2 10.3  NEUTROABS  --   --   --  8.9*  HGB 15.3 15.6 13.5 14.0  HCT 44.7 46.0 39.5 41.3  MCV 88.7  --  87.6 89.0  PLT 211  --  180 184   Basic Metabolic Panel:  Recent Labs Lab 05/13/17 1950 05/13/17 2001 05/14/17 0234 05/15/17 0324  NA 137 139 138 141  K 4.4 4.7 3.9 3.5  CL 103 103 106 105  CO2 24  --  27 27  GLUCOSE 132* 131* 140* 164*  BUN 9 11 8 7   CREATININE 1.01 0.90 0.78 0.82  CALCIUM 8.9  --  8.7* 8.9   GFR: Estimated Creatinine Clearance: 112.5 mL/min (by C-G formula based on SCr of 0.82 mg/dL). Liver Function Tests:  Recent Labs Lab 05/13/17 1950  AST 19  ALT 16*  ALKPHOS 69  BILITOT 0.5  PROT 7.8  ALBUMIN 4.1    Recent Labs Lab 05/15/17 0324  LIPASE 25  AMYLASE 44   No results for input(s): AMMONIA in the last 168 hours. Coagulation Profile: No results for input(s): INR, PROTIME in the last 168 hours. Cardiac Enzymes: No results for input(s): CKTOTAL, CKMB, CKMBINDEX, TROPONINI in the last 168 hours. BNP (last 3 results) No results for input(s): PROBNP in the last 8760 hours. HbA1C: No results for input(s): HGBA1C in the last 72 hours. CBG:  Recent Labs Lab 05/13/17 2356  GLUCAP 151*   Lipid Profile:  Recent Labs  05/13/17 1954  TRIG 234*   Thyroid Function Tests: No results for input(s): TSH, T4TOTAL, FREET4, T3FREE, THYROIDAB in the last 72 hours. Anemia Panel: No results for input(s): VITAMINB12, FOLATE, FERRITIN, TIBC, IRON, RETICCTPCT in the last 72 hours. Sepsis Labs:  Recent Labs Lab 05/13/17 2001  LATICACIDVEN 0.80    Recent Results (from the past 240 hour(s))  MRSA PCR Screening     Status: None   Collection Time: 05/13/17 11:42 PM  Result Value Ref Range Status   MRSA by PCR NEGATIVE NEGATIVE Final    Comment:        The GeneXpert MRSA Assay (FDA approved for NASAL specimens only), is one component of a comprehensive MRSA colonization surveillance program. It is not intended to diagnose  MRSA infection nor to guide or monitor treatment for MRSA infections.      Radiology Studies: Ct Head Wo Contrast  Result Date: 05/13/2017 CLINICAL DATA:  Unresponsive patient possible overdose EXAM: CT HEAD WITHOUT CONTRAST CT CERVICAL SPINE WITHOUT CONTRAST TECHNIQUE: Multidetector CT imaging of the head and cervical spine was performed following the standard protocol without intravenous contrast. Multiplanar CT image reconstructions of the cervical spine were also generated. COMPARISON:  03/09/2016 FINDINGS: CT HEAD FINDINGS Brain: No acute territorial infarction, hemorrhage or intracranial mass is seen. The ventricles are nonenlarged. Mild cortical atrophy. Vascular: No hyperdense vessels.  No unexpected calcification. Skull: No fracture or suspicious focal lesion Sinuses/Orbits: Fluid level in the left maxillary sinus with mucosal thickening. Mucosal thickening in the ethmoid sinuses. No acute orbital abnormality. Other: None CT CERVICAL SPINE FINDINGS Alignment: Straightening of the cervical spine. No subluxation. Facet alignment is within normal limits. Skull base and vertebrae: No acute fracture. No primary bone lesion or focal pathologic process. Soft tissues and spinal canal: No prevertebral fluid or swelling. No visible canal hematoma. Disc levels: Moderate degenerative changes at C5-C6. Mild changes at C4-C5 and C6-C7. Bilateral foraminal narrowing at C5-C6 and C6-C7. Upper chest: Negative. Other: None IMPRESSION: 1. No CT evidence for acute intracranial abnormality. 2. Straightening of the cervical spine. No acute fracture or malalignment 3. Sinusitis Electronically Signed   By: Jasmine Pang M.D.   On: 05/13/2017 20:34   Ct Cervical Spine Wo Contrast  Result Date: 05/13/2017 CLINICAL DATA:  Unresponsive patient possible overdose EXAM: CT HEAD WITHOUT CONTRAST CT CERVICAL SPINE WITHOUT CONTRAST TECHNIQUE: Multidetector CT imaging of the head and cervical spine was performed following the  standard protocol without intravenous contrast. Multiplanar CT image reconstructions of the cervical spine were also generated. COMPARISON:  03/09/2016 FINDINGS: CT HEAD FINDINGS Brain: No acute territorial infarction, hemorrhage or intracranial mass is seen. The ventricles are nonenlarged. Mild cortical atrophy. Vascular: No hyperdense vessels.  No unexpected calcification. Skull: No fracture or suspicious focal lesion Sinuses/Orbits: Fluid level in the left maxillary sinus with mucosal thickening. Mucosal thickening in the ethmoid sinuses. No acute orbital abnormality. Other: None CT CERVICAL SPINE FINDINGS Alignment: Straightening of the cervical spine. No subluxation. Facet alignment is within normal limits. Skull base and vertebrae: No acute fracture. No primary bone lesion or focal pathologic process. Soft tissues and spinal canal: No prevertebral fluid or swelling. No visible canal hematoma. Disc levels: Moderate degenerative changes at C5-C6. Mild changes at C4-C5 and C6-C7. Bilateral foraminal narrowing at C5-C6 and C6-C7. Upper chest: Negative. Other: None IMPRESSION: 1. No CT evidence for acute intracranial abnormality. 2. Straightening of the cervical spine. No acute fracture or malalignment 3. Sinusitis Electronically Signed   By: Selena Batten  Jake Samples M.D.   On: 05/13/2017 20:34   Dg Chest Portable 1 View  Result Date: 05/13/2017 CLINICAL DATA:  Status post intubation EXAM: PORTABLE CHEST 1 VIEW COMPARISON:  12/18/2011, 03/23/2009 FINDINGS: Endotracheal tube tip is about 2.7 cm superior to the carina. Esophageal tube tip is below the diaphragm but not included. Right infrahilar atelectasis or small infiltrate. No effusion. Normal heart size. No pneumothorax. IMPRESSION: 1. Endotracheal tube tip about 2.7 cm superior to the carina 2. Mild right infrahilar atelectasis or mild infiltrate Electronically Signed   By: Jasmine Pang M.D.   On: 05/13/2017 20:16    Scheduled Meds: . chlorhexidine  15 mL Mouth  Rinse BID  . enoxaparin (LOVENOX) injection  40 mg Subcutaneous Daily  . mouth rinse  15 mL Mouth Rinse q12n4p   Continuous Infusions: . sodium chloride 100 mL/hr at 05/15/17 1246  . sodium chloride       LOS: 2 days    Time spent: > 35 minutes    Penny Pia, MD [facility] Pager [phone]  If 7PM-7AM, please contact night-coverage [email or web address] Password [email or web address] 05/15/2017, 2:06 PM

## 2017-05-15 NOTE — Progress Notes (Signed)
eLink Physician-Brief Progress Note Patient Name: Omar StainsJohnny L Castillo DOB: Jul 14, 1966 MRN: [id number]   Date of Service  05/15/2017  HPI/Events of Note  Nausea/Vomiting - Refractory to Zofran. History of Opioid abuse. Opioid withdrawal?  eICU Interventions  Will order: 1. NPO. 2. NGT to LIS. 3. CBC, amylase and lipase now. 4. Send AM CBC now.  5. Please give small dose of Fentanyl as ordered.  6. 0.9 NaCl to run IV at 100 mL.hour.      Intervention Category Intermediate Interventions: Other:  Ceaser Ebeling Dennard Nipugene 05/15/2017, 2:03 AM

## 2017-05-15 NOTE — Evaluation (Signed)
SLP Cancellation Note  Patient Details Name: Omar Castillo MRN: [id number] DOB: Jun 20, 1966   Cancelled treatment:       Reason Eval/Treat Not Completed:  (pt vomited last night, does not want to try po at this time due to nausea) [name] [name], MS [facility] SLP [phone]   05/15/2017, 8:02 AM

## 2017-05-15 NOTE — Progress Notes (Signed)
Went into room to insert NGT as ordered per physician.  Patient refused NGT.  Patient informed that NGT will alleviate nausea and vomiting.  Patient stated that he would think about it.  Will continue to monitor for further symptoms.

## 2017-05-16 DIAGNOSIS — R4182 Altered mental status, unspecified: Secondary | ICD-10-CM

## 2017-05-16 NOTE — Progress Notes (Signed)
Pt's BP 168/77. MD made aware. Will continue to assess.

## 2017-05-16 NOTE — Progress Notes (Signed)
PROGRESS NOTE    Omar Castillo  UEA:[id number] DOB: 04-24-1966 DOA: 05/13/2017 PCP: Coralee Ruduran, Michael R, PA-C    Brief Narrative:   51 y/o that presented to the hospital after heroin OD. He has been experiencing poor oral intake and reports continued nausea which is improving on anti-emetics.  Assessment & Plan:   Active Problems:   Toxic encephalopathy - resolved. Patient is oriented 3  OD heroin - Continue on clonidine withdrawal order set - supportive therapy - Nausea most likely secondary withdrawal, advance diet as tolerated and continue antiemetics    AKI (acute kidney injury) (HCC) - resolved.    Acute hypercapnic respiratory failure (HCC) - resolved and was secondary to opiod   DVT prophylaxis: Lovenox Code Status: Full Family Communication: none at bedside Disposition Plan: d/c in the next 1-2 days   Consultants:   None   Procedures: none   Antimicrobials: None   Subjective: Patient's only complaint today is continued nausea. Otherwise no new complaints. He does report poor oral intake as a result.  Objective: Vitals:   05/15/17 1418 05/15/17 2139 05/16/17 0524 05/16/17 1408  BP: (!) 123/56 (!) 134/58 (!) 141/64 (!) 168/77  Pulse: 62 (!) 57 (!) 55 (!) 52  Resp: 16 18 18    Temp: 98.6 F (37 C) 98.6 F (37 C) 98.4 F (36.9 C) 99.1 F (37.3 C)  TempSrc:  Oral Oral Oral  SpO2: 97% 100% 98% 99%  Weight:   79 kg (174 lb 1.6 oz)   Height:       No intake or output data in the 24 hours ending 05/16/17 1658 Filed Weights   05/14/17 0500 05/15/17 0500 05/16/17 0524  Weight: 81.2 kg (179 lb 0.2 oz) 82.1 kg (181 lb) 79 kg (174 lb 1.6 oz)    Examination:Exam unchanged when compared to 05/15/2017  General exam: Appears calm and comfortable, in nad. Respiratory system: Clear to auscultation. Respiratory effort normal. No wheezes Cardiovascular system: no cyanosis Gastrointestinal system: Abdomen is nondistended, soft and nontender. No organomegaly  or masses felt. Normal bowel sounds heard. Central nervous system: Alert and oriented. No focal neurological deficits. Extremities: Symmetric 5 x 5 power. Skin: No rashes, lesions or ulcers, on limited exam. Psychiatry: Mood & affect appropriate.   Data Reviewed: I have personally reviewed following labs and imaging studies  CBC:  Recent Labs Lab 05/13/17 1950 05/13/17 2001 05/14/17 0234 05/15/17 0324  WBC 11.2*  --  9.2 10.3  NEUTROABS  --   --   --  8.9*  HGB 15.3 15.6 13.5 14.0  HCT 44.7 46.0 39.5 41.3  MCV 88.7  --  87.6 89.0  PLT 211  --  180 184   Basic Metabolic Panel:  Recent Labs Lab 05/13/17 1950 05/13/17 2001 05/14/17 0234 05/15/17 0324  NA 137 139 138 141  K 4.4 4.7 3.9 3.5  CL 103 103 106 105  CO2 24  --  27 27  GLUCOSE 132* 131* 140* 164*  BUN 9 11 8 7   CREATININE 1.01 0.90 0.78 0.82  CALCIUM 8.9  --  8.7* 8.9   GFR: Estimated Creatinine Clearance: 112.5 mL/min (by C-G formula based on SCr of 0.82 mg/dL). Liver Function Tests:  Recent Labs Lab 05/13/17 1950  AST 19  ALT 16*  ALKPHOS 69  BILITOT 0.5  PROT 7.8  ALBUMIN 4.1    Recent Labs Lab 05/15/17 0324  LIPASE 25  AMYLASE 44   No results for input(s): AMMONIA in the last 168 hours.  Coagulation Profile: No results for input(s): INR, PROTIME in the last 168 hours. Cardiac Enzymes: No results for input(s): CKTOTAL, CKMB, CKMBINDEX, TROPONINI in the last 168 hours. BNP (last 3 results) No results for input(s): PROBNP in the last 8760 hours. HbA1C: No results for input(s): HGBA1C in the last 72 hours. CBG:  Recent Labs Lab 05/13/17 2356  GLUCAP 151*   Lipid Profile:  Recent Labs  05/13/17 1954  TRIG 234*   Thyroid Function Tests: No results for input(s): TSH, T4TOTAL, FREET4, T3FREE, THYROIDAB in the last 72 hours. Anemia Panel: No results for input(s): VITAMINB12, FOLATE, FERRITIN, TIBC, IRON, RETICCTPCT in the last 72 hours. Sepsis Labs:  Recent Labs Lab  05/13/17 2001  LATICACIDVEN 0.80    Recent Results (from the past 240 hour(s))  MRSA PCR Screening     Status: None   Collection Time: 05/13/17 11:42 PM  Result Value Ref Range Status   MRSA by PCR NEGATIVE NEGATIVE Final    Comment:        The GeneXpert MRSA Assay (FDA approved for NASAL specimens only), is one component of a comprehensive MRSA colonization surveillance program. It is not intended to diagnose MRSA infection nor to guide or monitor treatment for MRSA infections.      Radiology Studies: No results found.  Scheduled Meds: . chlorhexidine  15 mL Mouth Rinse BID  . enoxaparin (LOVENOX) injection  40 mg Subcutaneous Daily  . mouth rinse  15 mL Mouth Rinse q12n4p   Continuous Infusions: . sodium chloride 100 mL/hr at 05/16/17 0916  . sodium chloride       LOS: 3 days    Time spent: > 35 minutes    Penny Pia, MD [facility] Pager [phone]  If 7PM-7AM, please contact night-coverage [email or web address] Password [email or web address] 05/16/2017, 4:58 PM

## 2017-05-16 NOTE — Evaluation (Signed)
Clinical/Bedside Swallow Evaluation Patient Details  Name: Omar Castillo MRN: [id number] Date of Birth: [birth year]  Today's Date: 05/16/2017 Time: SLP Start Time (ACUTE ONLY): 0855 SLP Stop Time (ACUTE ONLY): 0915 SLP Time Calculation (min) (ACUTE ONLY): 20 min  Past Medical History:  Past Medical History:  Diagnosis Date  . Back pain, chronic    Past Surgical History:  Past Surgical History:  Procedure Laterality Date  . BACK SURGERY     HPI:  [id number] year old male with a past medical history significant for narcotic abuse was apparently snorting heroin a day ago and then was found by his niece and sister with severe encephalopathy/unresponsiveness. Intubated for airway protection, 05/13/17-05/14/17.   Assessment / Plan / Recommendation Clinical Impression  Pt appears at reduced risk for aspiration when gfollowing general aspiration precautions. Pt consumed trials of graham crackers wtih functional oropharyngeal abilities. Pt currently on full liquid diet and consumption 4 oz of thin ginger ale via straw with no overt s/s of aspriation. Recommend upgrading pt's diet to regular with thin liquids, medicine whole with water. No further ST follow-up indicated.  SLP Visit Diagnosis: Dysphagia, oropharyngeal phase (R13.12)    Aspiration Risk  No limitations    Diet Recommendation Regular;Thin liquid   Liquid Administration via: Cup;Straw Medication Administration: Whole meds with liquid Supervision: Patient able to self feed Postural Changes: Seated upright at 90 degrees    Other  Recommendations Oral Care Recommendations: Oral care BID   Follow up Recommendations None      Frequency and Duration            Prognosis Prognosis for Safe Diet Advancement: Good      Swallow Study   General Date of Onset: 05/13/17 HPI: [id number] year old male with a past medical history significant for narcotic abuse was apparently snorting heroin a day ago and then was found by his niece and sister with  severe encephalopathy/unresponsiveness. Intubated for airway protection, 05/13/17-05/14/17. Type of Study: Bedside Swallow Evaluation Previous Swallow Assessment: none in chart Diet Prior to this Study: Thin liquids Temperature Spikes Noted: No Respiratory Status: Room air History of Recent Intubation: Yes Length of Intubations (days): 1 days Date extubated: 05/13/17 Behavior/Cognition: Alert Oral Cavity Assessment: Within Functional Limits Oral Care Completed by SLP: No Oral Cavity - Dentition: Adequate natural dentition Vision: Functional for self-feeding Self-Feeding Abilities: Able to feed self Patient Positioning: Upright in bed Baseline Vocal Quality: Normal Volitional Cough: Strong Volitional Swallow: Able to elicit    Oral/Motor/Sensory Function Overall Oral Motor/Sensory Function: Within functional limits   Ice Chips Ice chips: Within functional limits Presentation: Self Fed;Spoon   Thin Liquid Thin Liquid: Within functional limits Presentation: Straw;Self Fed    Nectar Thick Nectar Thick Liquid: Not tested   Honey Thick Honey Thick Liquid: Not tested   Puree Puree: Not tested   Solid   GO   Solid: Within functional limits Presentation: Self Fed        Omar Castillo 05/16/2017,9:32 AM

## 2017-05-17 DIAGNOSIS — F1110 Opioid abuse, uncomplicated: Secondary | ICD-10-CM

## 2017-05-17 MED ORDER — ONDANSETRON 4 MG PO TBDP: 4.0000 mg | ORAL_TABLET | Freq: Four times a day (QID) | ORAL | 0 refills | Status: DC | PRN

## 2017-05-17 MED ORDER — BUPRENORPHINE HCL-NALOXONE HCL 2-0.5 MG SL SUBL
2.0000 | SUBLINGUAL_TABLET | Freq: Every day | SUBLINGUAL | Status: DC
  Administered 2017-05-17: 2 via SUBLINGUAL
  Filled 2017-05-17: qty 2

## 2017-05-17 MED ORDER — BUPRENORPHINE HCL-NALOXONE HCL 2-0.5 MG SL SUBL: 8.0000 | SUBLINGUAL_TABLET | Freq: Once | SUBLINGUAL | 0 refills | Status: AC

## 2017-05-17 MED ORDER — BUPRENORPHINE HCL-NALOXONE HCL 2-0.5 MG SL SUBL
2.0000 | SUBLINGUAL_TABLET | Freq: Once | SUBLINGUAL | Status: AC
  Administered 2017-05-17: 2 via SUBLINGUAL
  Filled 2017-05-17 (×2): qty 2

## 2017-05-17 NOTE — Progress Notes (Signed)
    I checked on [name] about 90 minutes after his first induction 4mg  dose of Suboxone. He is feeling better, withdrawal symptoms are improving. I want to give him another 4mg  dose of Suboxone this afternoon. Then plan is to start 16mg  daily tomorrow. He understands.   Tyson AliasVincent, Justice Milliron Thomas, MD 05/17/2017, 1:15 PM

## 2017-05-17 NOTE — Consult Note (Signed)
Omar Castillo is a 51 year old man with severe opioid use disorder admitted on 9/1 to the medical ICU because of acute hypercarbic respiratory failure due to opioid overdose. I was asked by Dr. Edward JollySilva from the hospitalist team to evaluate [name] for medication assisted therapy with Suboxone. The patient has had a long history of L4-L5 lumbar osteoarthritis which he tells me has required three surgeries for decompression and fusion. He has been on chronic opioid therapy since at least 2000. Over the last several years his use of opioids has progressed into florid addiction. He is prescribed oxycodone 15 mg, 5 tablets daily [place] [facility]. Patient tells me that he takes these like candy, usually runs out about 2 weeks early. He supplements by using heroin, generally half a gram 3 times a week, he would use daily if he could afford it. If he does not use an opioid, he quickly feels withdrawal. He also has significant cravings throughout the day. He's never been in a formal treatment program. This is his first hospitalization for overdose. He does not remember the events leading up to the overdose, just says that he used heroin, and then woke up in the hospital. No past history of endocarditis or hepatitis C infection. His primary care physician does not know about his heroin use.   Currently he feels like he is going through moderate withdrawal right now. He feels anxious and jittery. He feels nauseous, no vomiting. He has a headache. No diaphoresis or piloerection. He has been receiving moderate doses of IV fentanyl for withdrawal symptoms, last dosed seven hours ago. He is interested in medication assisted therapy. 2 years ago he bought Suboxone off the street and tried to treat his own addiction for 4 days, said it made him feel normal. York SpanielSaid it took the edge off his chronic pain. He really enjoyed it. He was able to get a job, but then says when he got money he went back to  using heroin. He has never been in a formal rehabilitation program. [place] mother [place], says that it is a safe and supportive living environment. Denies living with any other users. He is unemployed, he does odd jobs to make some money, does not have insurance, spends about $70 a month on oxycodone and $20 a day on heroin.  Severe Opioid Use Disorder: Patient with chronic pain, now overusing prescribed oxycodone, and supplementing with heroin, leading to this admission for overdose that required mechanical ventilation. He is currently in moderate withdrawal. COWS score of 11 right now. I think he is a good candidate to try Buprenorphine based treatment, he has good incite into his addiction, wants treatment, and has had some experience with Suboxone. We talked briefly about Methadone, but he prefers Suboxone first. Plan is to induce with Suboxone now, I have ordered 4mg  once for now. Stop fentanyl. If he does well with this we can repeat another 2-4 mg dose early this afternoon. Tomorrow he will start Suboxone 16mg  daily, and continue that for one week. I gave him a paper prescription for this. [facility] on Tuesday 9/11 at 9:45 am. Our clinic number is [phone] if he has any questions or problems. We will continue MAT as an outpatient, and potentially connect him with counseling services. He understands we expect him to stop Oxycodone as an outpatient, the Suboxone will keep him out of  withdrawal and hopefully treat his chronic pain as well.   [name], MD 05/17/2017, 10:14 AM

## 2017-05-17 NOTE — Discharge Summary (Signed)
Physician Discharge Summary  ALANSON HAUSMANN  ONG:[id number]  DOB: 1966/07/13  DOA: 05/13/2017 PCP: Coralee Rud, PA-C  Admit date: 05/13/2017 Discharge date: 05/17/2017   Admitted From: Home  Disposition:  Home   Recommendations for Outpatient Follow-up:  1. Follow up with PCP in 1-2 weeks  Discharge Condition: Stable  CODE STATUS: FULL  Diet recommendation: Heart Healthy   Brief/Interim Summary: Omar Castillo a 51 year old male with significant past medical history of osteoarthritis on chronic opiate therapy who was brought by EMS after family called 911 for altered mental status. Patient was intubated in the field for airway protection. Per family patient was snorting heroine and progressively became less responsive. Patient was admitted to the ICU on 05/13/2017. Patient was successfully extubated on 05/15/2017 and transferred to the medical floor. Patient presented some signs of opiate withdrawal for which he was started on clonidine, subsequently addiction medicine team was consulted and patient was started on Suboxone. Patient clinically improved with no further concerns. Patient was discharged home to follow up with PCP and addiction medicine.  Subjective: Patient seen and examined on the day of discharge, was feeling anxious, nauseous and jittery, which resolved after the addition of Suboxone. Patient feels well, denies any chest pain, shortness of breath, palpitations and weakness. Tolerating diet well and ambulating with no issues  Discharge Diagnoses/Hospital Course:  Acute hypercarbic respiratory failure due to opioid overdose  Extubated 05/15/2017 Resolve  Severe opiate dependent Addiction medicine consult Patient started on Suboxone prescription was given  Follow up at Opioid disorder clinic on 9/11 @ 9:45  Acute renal failure - resolved Treated with IV fluid Monitor as an outpatient  Toxic metabolic encephalopathy Secondary to opioid overdose This has  resolved  On the day of the discharge the patient's vitals were stable, and no other acute medical condition were reported by patient. The patient was felt safe to be discharge to home  Discharge Instructions  You were cared for by a hospitalist during your hospital stay. If you have any questions about your discharge medications or the care you received while you were in the hospital after you are discharged, you can call the unit and asked to speak with the hospitalist on call if the hospitalist that took care of you is not available. Once you are discharged, your primary care physician will handle any further medical issues. Please note that NO REFILLS for any discharge medications will be authorized once you are discharged, as it is imperative that you return to your primary care physician (or establish a relationship with a primary care physician if you do not have one) for your aftercare needs so that they can reassess your need for medications and monitor your lab values.  Discharge Instructions    Call MD for:  difficulty breathing, headache or visual disturbances    Complete by:  As directed    Call MD for:  extreme fatigue    Complete by:  As directed    Call MD for:  hives    Complete by:  As directed    Call MD for:  persistant dizziness or light-headedness    Complete by:  As directed    Call MD for:  persistant nausea and vomiting    Complete by:  As directed    Call MD for:  redness, tenderness, or signs of infection (pain, swelling, redness, odor or green/yellow discharge around incision site)    Complete by:  As directed    Call MD for:  severe uncontrolled pain    Complete by:  As directed    Call MD for:  temperature >100.4    Complete by:  As directed    Diet - low sodium heart healthy    Complete by:  As directed    Increase activity slowly    Complete by:  As directed      Allergies as of 05/17/2017   No Known Allergies     Medication List    STOP taking these  medications   oxyCODONE 15 MG immediate release tablet Commonly known as:  ROXICODONE     TAKE these medications   buprenorphine-naloxone 2-0.5 mg Subl SL tablet Commonly known as:  SUBOXONE Place 8 tablets under the tongue once.   ondansetron 4 MG disintegrating tablet Commonly known as:  ZOFRAN-ODT Take 1 tablet (4 mg total) by mouth every 6 (six) hours as needed for nausea or vomiting.            Discharge Care Instructions        Start     Ordered   05/17/17 0000  ondansetron (ZOFRAN-ODT) 4 MG disintegrating tablet  Every 6 hours PRN     05/17/17 1144   05/17/17 0000  buprenorphine-naloxone (SUBOXONE) 2-0.5 mg SUBL SL tablet   Once     05/17/17 1439   05/17/17 0000  Increase activity slowly     05/17/17 1439   05/17/17 0000  Diet - low sodium heart healthy     05/17/17 1439   05/17/17 0000  Call MD for:  temperature >100.4     05/17/17 1439   05/17/17 0000  Call MD for:  persistant nausea and vomiting     05/17/17 1439   05/17/17 0000  Call MD for:  severe uncontrolled pain     05/17/17 1439   05/17/17 0000  Call MD for:  redness, tenderness, or signs of infection (pain, swelling, redness, odor or green/yellow discharge around incision site)     05/17/17 1439   05/17/17 0000  Call MD for:  difficulty breathing, headache or visual disturbances     05/17/17 1439   05/17/17 0000  Call MD for:  hives     05/17/17 1439   05/17/17 0000  Call MD for:  persistant dizziness or light-headedness     05/17/17 1439   05/17/17 0000  Call MD for:  extreme fatigue     05/17/17 1439     Follow-up Information    Coralee Ruduran, Michael R, PA-C. Schedule an appointment as soon as possible for a visit in 1 week(s).   Specialty:  Cardiology Contact information: [facility]  [address] [place] [address] [phone]        [facility]. Go on 05/23/2017.   Why:  Opiod disorder clinic @ 9:45 AM @ [facility]  Follow up for Suboxone  Clinic  number is [phone]          No Known Allergies  Consultations:  Opoid disorder medical team    Procedures/Studies: Ct Head Wo Contrast  Result Date: 05/13/2017 CLINICAL DATA:  Unresponsive patient possible overdose EXAM: CT HEAD WITHOUT CONTRAST CT CERVICAL SPINE WITHOUT CONTRAST TECHNIQUE: Multidetector CT imaging of the head and cervical spine was performed following the standard protocol without intravenous contrast. Multiplanar CT image reconstructions of the cervical spine were also generated. COMPARISON:  03/09/2016 FINDINGS: CT HEAD FINDINGS Brain: No acute territorial infarction, hemorrhage or intracranial mass is seen. The ventricles are nonenlarged. Mild cortical  atrophy. Vascular: No hyperdense vessels.  No unexpected calcification. Skull: No fracture or suspicious focal lesion Sinuses/Orbits: Fluid level in the left maxillary sinus with mucosal thickening. Mucosal thickening in the ethmoid sinuses. No acute orbital abnormality. Other: None CT CERVICAL SPINE FINDINGS Alignment: Straightening of the cervical spine. No subluxation. Facet alignment is within normal limits. Skull base and vertebrae: No acute fracture. No primary bone lesion or focal pathologic process. Soft tissues and spinal canal: No prevertebral fluid or swelling. No visible canal hematoma. Disc levels: Moderate degenerative changes at C5-C6. Mild changes at C4-C5 and C6-C7. Bilateral foraminal narrowing at C5-C6 and C6-C7. Upper chest: Negative. Other: None IMPRESSION: 1. No CT evidence for acute intracranial abnormality. 2. Straightening of the cervical spine. No acute fracture or malalignment 3. Sinusitis Electronically Signed   By: Jasmine Pang M.D.   On: 05/13/2017 20:34   Ct Cervical Spine Wo Contrast  Result Date: 05/13/2017 CLINICAL DATA:  Unresponsive patient possible overdose EXAM: CT HEAD WITHOUT CONTRAST CT CERVICAL SPINE WITHOUT CONTRAST TECHNIQUE: Multidetector CT imaging of the head and cervical spine  was performed following the standard protocol without intravenous contrast. Multiplanar CT image reconstructions of the cervical spine were also generated. COMPARISON:  03/09/2016 FINDINGS: CT HEAD FINDINGS Brain: No acute territorial infarction, hemorrhage or intracranial mass is seen. The ventricles are nonenlarged. Mild cortical atrophy. Vascular: No hyperdense vessels.  No unexpected calcification. Skull: No fracture or suspicious focal lesion Sinuses/Orbits: Fluid level in the left maxillary sinus with mucosal thickening. Mucosal thickening in the ethmoid sinuses. No acute orbital abnormality. Other: None CT CERVICAL SPINE FINDINGS Alignment: Straightening of the cervical spine. No subluxation. Facet alignment is within normal limits. Skull base and vertebrae: No acute fracture. No primary bone lesion or focal pathologic process. Soft tissues and spinal canal: No prevertebral fluid or swelling. No visible canal hematoma. Disc levels: Moderate degenerative changes at C5-C6. Mild changes at C4-C5 and C6-C7. Bilateral foraminal narrowing at C5-C6 and C6-C7. Upper chest: Negative. Other: None IMPRESSION: 1. No CT evidence for acute intracranial abnormality. 2. Straightening of the cervical spine. No acute fracture or malalignment 3. Sinusitis Electronically Signed   By: Jasmine Pang M.D.   On: 05/13/2017 20:34   Dg Chest Portable 1 View  Result Date: 05/13/2017 CLINICAL DATA:  Status post intubation EXAM: PORTABLE CHEST 1 VIEW COMPARISON:  12/18/2011, 03/23/2009 FINDINGS: Endotracheal tube tip is about 2.7 cm superior to the carina. Esophageal tube tip is below the diaphragm but not included. Right infrahilar atelectasis or small infiltrate. No effusion. Normal heart size. No pneumothorax. IMPRESSION: 1. Endotracheal tube tip about 2.7 cm superior to the carina 2. Mild right infrahilar atelectasis or mild infiltrate Electronically Signed   By: Jasmine Pang M.D.   On: 05/13/2017 20:16    Discharge  Exam: Vitals:   05/17/17 0601 05/17/17 1420  BP: (!) 151/67 (!) 168/82  Pulse: 63 64  Resp: 18 16  Temp: 98.4 F (36.9 C) 99.3 F (37.4 C)  SpO2: 96% 97%   Vitals:   05/16/17 1408 05/16/17 2114 05/17/17 0601 05/17/17 1420  BP: (!) 168/77 (!) 156/69 (!) 151/67 (!) 168/82  Pulse: (!) 52 (!) 55 63 64  Resp:  18 18 16   Temp: 99.1 F (37.3 C) 98.5 F (36.9 C) 98.4 F (36.9 C) 99.3 F (37.4 C)  TempSrc: Oral Oral Oral   SpO2: 99% 97% 96% 97%  Weight:   78.2 kg (172 lb 4.8 oz)   Height:  General: Pt is alert, awake, not in acute distress Cardiovascular: RRR, S1/S2 +, no rubs, no gallops Respiratory: CTA bilaterally, no wheezing, no rhonchi Abdominal: Soft, NT, ND, bowel sounds + Extremities: no edema, no cyanosis   The results of significant diagnostics from this hospitalization (including imaging, microbiology, ancillary and laboratory) are listed below for reference.     Microbiology: Recent Results (from the past 240 hour(s))  MRSA PCR Screening     Status: None   Collection Time: 05/13/17 11:42 PM  Result Value Ref Range Status   MRSA by PCR NEGATIVE NEGATIVE Final    Comment:        The GeneXpert MRSA Assay (FDA approved for NASAL specimens only), is one component of a comprehensive MRSA colonization surveillance program. It is not intended to diagnose MRSA infection nor to guide or monitor treatment for MRSA infections.      Labs: BNP (last 3 results) No results for input(s): BNP in the last 8760 hours. Basic Metabolic Panel:  Recent Labs Lab 05/13/17 1950 05/13/17 2001 05/14/17 0234 05/15/17 0324  NA 137 139 138 141  K 4.4 4.7 3.9 3.5  CL 103 103 106 105  CO2 24  --  27 27  GLUCOSE 132* 131* 140* 164*  BUN 9 11 8 7   CREATININE 1.01 0.90 0.78 0.82  CALCIUM 8.9  --  8.7* 8.9   Liver Function Tests:  Recent Labs Lab 05/13/17 1950  AST 19  ALT 16*  ALKPHOS 69  BILITOT 0.5  PROT 7.8  ALBUMIN 4.1    Recent Labs Lab  05/15/17 0324  LIPASE 25  AMYLASE 44   No results for input(s): AMMONIA in the last 168 hours. CBC:  Recent Labs Lab 05/13/17 1950 05/13/17 2001 05/14/17 0234 05/15/17 0324  WBC 11.2*  --  9.2 10.3  NEUTROABS  --   --   --  8.9*  HGB 15.3 15.6 13.5 14.0  HCT 44.7 46.0 39.5 41.3  MCV 88.7  --  87.6 89.0  PLT 211  --  180 184   Cardiac Enzymes: No results for input(s): CKTOTAL, CKMB, CKMBINDEX, TROPONINI in the last 168 hours. BNP: Invalid input(s): POCBNP CBG:  Recent Labs Lab 05/13/17 2356  GLUCAP 151*   D-Dimer No results for input(s): DDIMER in the last 72 hours. Hgb A1c No results for input(s): HGBA1C in the last 72 hours. Lipid Profile No results for input(s): CHOL, HDL, LDLCALC, TRIG, CHOLHDL, LDLDIRECT in the last 72 hours. Thyroid function studies No results for input(s): TSH, T4TOTAL, T3FREE, THYROIDAB in the last 72 hours.  Invalid input(s): FREET3 Anemia work up No results for input(s): VITAMINB12, FOLATE, FERRITIN, TIBC, IRON, RETICCTPCT in the last 72 hours. Urinalysis    Component Value Date/Time   COLORURINE YELLOW 01/28/2015 1337   APPEARANCEUR CLEAR 01/28/2015 1337   LABSPEC 1.018 01/28/2015 1337   PHURINE 5.5 01/28/2015 1337   GLUCOSEU NEGATIVE 01/28/2015 1337   HGBUR NEGATIVE 01/28/2015 1337   BILIRUBINUR NEGATIVE 01/28/2015 1337   KETONESUR NEGATIVE 01/28/2015 1337   PROTEINUR NEGATIVE 01/28/2015 1337   UROBILINOGEN 0.2 01/28/2015 1337   NITRITE NEGATIVE 01/28/2015 1337   LEUKOCYTESUR NEGATIVE 01/28/2015 1337   Sepsis Labs Invalid input(s): PROCALCITONIN,  WBC,  LACTICIDVEN Microbiology Recent Results (from the past 240 hour(s))  MRSA PCR Screening     Status: None   Collection Time: 05/13/17 11:42 PM  Result Value Ref Range Status   MRSA by PCR NEGATIVE NEGATIVE Final    Comment:  The GeneXpert MRSA Assay (FDA approved for NASAL specimens only), is one component of a comprehensive MRSA colonization surveillance  program. It is not intended to diagnose MRSA infection nor to guide or monitor treatment for MRSA infections.      Time coordinating discharge: 35 minutes  SIGNED:  Latrelle Dodrill, MD  [facility] 05/17/2017, 2:39 PM  Pager please text page via  [email or web address] Password [email or web address]

## 2017-05-17 NOTE — Progress Notes (Signed)
Discharge instructions (including medications) discussed with and copy provided to patient/caregiver 

## 2017-05-23 ENCOUNTER — Ambulatory Visit (INDEPENDENT_AMBULATORY_CARE_PROVIDER_SITE_OTHER): Payer: Self-pay | Admitting: Internal Medicine

## 2017-05-23 ENCOUNTER — Telehealth: Payer: Self-pay

## 2017-05-23 ENCOUNTER — Encounter: Payer: Self-pay | Admitting: Internal Medicine

## 2017-05-23 DIAGNOSIS — F17210 Nicotine dependence, cigarettes, uncomplicated: Secondary | ICD-10-CM

## 2017-05-23 DIAGNOSIS — F411 Generalized anxiety disorder: Secondary | ICD-10-CM

## 2017-05-23 DIAGNOSIS — F329 Major depressive disorder, single episode, unspecified: Secondary | ICD-10-CM

## 2017-05-23 DIAGNOSIS — F1120 Opioid dependence, uncomplicated: Secondary | ICD-10-CM | POA: Insufficient documentation

## 2017-05-23 HISTORY — DX: Opioid dependence, uncomplicated: F11.20

## 2017-05-23 MED ORDER — BUPRENORPHINE HCL-NALOXONE HCL 8-2 MG SL FILM: 2.0000 | ORAL_FILM | Freq: Every day | SUBLINGUAL | 0 refills | Status: DC

## 2017-05-23 MED ORDER — CITALOPRAM HYDROBROMIDE 10 MG PO TABS: 10.0000 mg | ORAL_TABLET | Freq: Every day | ORAL | 1 refills | Status: DC

## 2017-05-23 MED ORDER — BUSPIRONE HCL 5 MG PO TABS: 5.0000 mg | ORAL_TABLET | Freq: Three times a day (TID) | ORAL | 1 refills | Status: DC

## 2017-05-23 NOTE — Telephone Encounter (Signed)
Needs to speak with a nurse about med. Please call back.  

## 2017-05-23 NOTE — Progress Notes (Signed)
05/23/2017  Omar Castillo presents for buprenorphine/naloxone intake visit.   I have reviewed EPIC data including labwork which was available.  I have reviewed outside records provided by patient if available.  The salient points were confirmed with the patient.    Review of substance use history (first use, substances used, any illicit purchases): Patient has a long history of L4-L5 lumbar osteoarthritis s/p three surgeries for decompression and fusion. He has been on chronic opioid therapy since 2000. Over the past several years his use of opioids has progressed to addiction. Prior to recent hospitalization, patient was prescribed oxycodone 15 mg, 5 tablets daily through a pain clinic. He reported normally running out of his prescription 2 weeks early and supplementation by using heroin or illicit fentanyl. He denies every injecting drugs, reports he always snorted the heroin and fentanyl. Patient reports he always passed his UDS with the pain clinic because he could anticipate when he would be tested and knew how long it took for his urine to be clean. Whenever patient was not using, he reports severe withdrawal symptoms and cravings. He has never been in a formal treatment program. He was recently hospitalized on 05/13/2017 for AMS and respiratory failure requiring intubation due to heroin and xanax overdose. Patient does not remember the events leading up to his hospitalization and reports waking up in the ICU. He reports that his mother with whom he lives found him down. He is very grateful to be alive and considered that hospitalization to be a wake-up call. He was initiated on suboxone therapy while in the hospital on 05/17/2017.   Since discharge he has been doing very well on 16 mg daily (8 mg BID).  He does ensorse side effect of insomnia that he attributes to the evening dose of suboxone. He denies significant cravings or withdrawal symptoms. He denies illicit substance use aside from ongoing  xanax use. He endorses symptoms of uncontrolled anxiety. He has never been on an SSRI/SNRI or seen a therapist for depression/anxiety. Today we discussed the risks of on going xanax use especially while on suboxone. He expressed a desire to quit using benzodiazepines. He is open to trying an SSRI or Buspar. Prior to his overdose, patient was taking 5-6 mg of xanax a day. Currently reports taking 0.5 mg every other day. He has been taking it for 23 years. Due to the increased risk of serotonin syndrome with starting both an SSRI and buspar concurrently, we will start with Buspar TID and add citalopram later if symptoms remain uncontrolled. Patient was counseled on cessation of xanax use. Psychiatry referral offered but patient refused.   Last substance used: Lasted used heroin/fentanyl the day of admission, 05/13/17. Last used illict xanax yesterday, 05/22/17.   If last substance not an opioid, last opioid used (type, dose, route, withdrawal symptoms): Last used opioid on 05/22/17 was heroin and fentanyl that he reports snorting. Denies ever having used IV.   Mental Health History: Anxiety, depression   Current counseling/behavioural health provider: None   This patient has Opioid Use Disorder by following DSM-V criteria: Keep those that apply.  - Opioids taken in larger amounts or over a longer period than intended - Persistent desire to cut down - A great deal of time is spent to obtain/use/recover from the opioid - Cravings to use opioids - Use despite knowledge of health problems caused by opioids - Tolerance - Withdrawal  Past Medical History:  Diagnosis Date  . Back pain, chronic  Current Outpatient Prescriptions on File Prior to Visit  Medication Sig Dispense Refill  . ondansetron (ZOFRAN-ODT) 4 MG disintegrating tablet Take 1 tablet (4 mg total) by mouth every 6 (six) hours as needed for nausea or vomiting. 10 tablet 0   No current facility-administered medications on file prior to  visit.     Physical Exam  Vitals:   05/23/17 0942  BP: (!) 145/79  Pulse: 85  Resp: 20  Temp: 98.1 F (36.7 C)  TempSrc: Oral  SpO2: 100%  Weight: 173 lb 9.6 oz (78.7 kg)  Height:  (1.702 m)   Constitutional: NAD, appears comfortable HEENT: Atraumatic, normocephalic. PERRL, pupils normal in size.  Cardiovascular: RRR Pulmonary/Chest: CTAB Abdominal: Soft, non tender, non distended. +BS.  Extremities: Warm and well perfused.No edema.  Neurological: A&Ox3, CN II - XII grossly intact. No tremor.  Skin: No rashes or erythema  Psychiatric: Normal mood and affect   Clinical Opiate Withdrawal Scale: bold applicable COWS scoring   - Resting HR:    - 0 for < 80   - 1 for 81 - 100   - 2 for 101 - 120   - 4 for > 120  - Sweating:   - 0 for no chills/flushing   - 1 for subjective chills/flushing   - 3 for beads of sweat on brow/face   - 4 for sweat streaming off of face  - Restlessness:    - 0 for able to sit still   - 1 for subjective difficulty sitting still   - 3 for frequent shifting or extraneous movement   - 5 for unable to sit still for more than a few seconds  - Pupil size:    - 0 for pinpoint or normal   - 1 for possibly larger than normal   - 2 for moderately dilated   - 5 for only iris rim visible  - Bone/joint pain:    - 0 for not present   - 1 for mild diffuse discomfort   - 2 severe diffuse aching   - 4 for objectively rubbing joints/muscles and obviously in pain  - Runny nose/tearing:    - 0 for not present   - 1 for stuffy nose/moist eyes   - 2 for nose running/tearing   - 4 for nose constantly running or tears streaming down cheeks  - GI Upset:    - 0 for no GI symptoms   - 1 for stomach cramps   - 2 for nausea or loose stool   - 3 for vomiting or diarrhea   - 5 for multiple episodes of vomiting or diarrhea  - Tremor observation of outstretched hands:    - 0 for no tremor   - 1 for tremor can be felt but not observed   - 2 for slight  tremor observable   - 4 for gross tremor or muscle twitching  - Yawning:    - 0 for no yawning   - 1 for yawning once or twice during assessment   - 2 for yawning three or more times during assessment   - 4 for yawning several times per minute  - Anxiety or irritability:    - 0 for none   - 1 for patient reports increasing irritability or anxiousness   - 2 for patient obviously irritable/anxious   - 4 for patient so irritable/anxious that assessment is difficult  - Gooseflesh:    - 0 for skin is smooth   -  3 for piloerection of skin can be felt or seen   - 5 for prominent piloerection  TOTAL: 4 points  Assessment/Plan:   Based on a review of the patient's medical history including substance use and mental health factors, and physical exam, Omar Castillo is a suitable candidate for MAT with buprenorphine/naloxone.  UDS ordered this visit.    HIV ab screen from 05/14/2017 was non reactive. Hepatitis C screening deferred today due to patient never injecting heroin, reports only snorting. CMET from 05/13/17 reviewed and LFTs are normal.   Intervisit Care:  We discussed this medication must be kept in a safe place and away from children.   We will see the patient back in 1 week in clinic, with options for a sooner appointment based on patient and provider preference.   Patient was encouraged to call the office and speak with the MD on call for any urgent concerns.    Reymundo Poll, MD 05/23/2017 10:17 AM

## 2017-05-23 NOTE — Patient Instructions (Addendum)
[name],  It was a pleasure to see you today. Please take both of your suboxone films in the morning. If you develop withdrawal symptoms or cravings, you may take 1 and 1/2 films in the morning and 1/2 film in the evening.   For your anxiety, please stop taking xanax. Please start taking Buspar 5 mg three times a day.   Please follow up with us in 1 week. If you have any questions or concerns, call our clinic at [phone] or after hours call [phone] and ask for the internal medicine resident on call. Thank you!  - Dr. Antony ContrasGuilloud

## 2017-05-23 NOTE — Assessment & Plan Note (Signed)
Patient is a 51 yo M with a history of severe opioid use disorder now doing well on suboxone 16 mg daily. He was recently discharged from the hospital after admission and intubation for heroin, fentanyl, and xanax overdose. He denies ever injecting drugs, always snorted his heroin and fentanyl. He admits to ongoing xanax use but has essentially tapered himself down to 0.5 mg every other day. Prior to hospitalization patient was taking 5-6 mg of xanax daily (see anxiety A&P). He denies significant opioid cravings or withdrawal symptoms. He is very grateful for his care and thankful to be alive. He feels well on suboxone and more like himself. He does report side effect of insomnia with the evening dose. We discussed changing his regimen to taking both films in the morning or 1 and 1/2 films in the morning and 1/2 film at night to help with this side effect. He is agreeable to this plan.  -- F/u UDS -- Continue buprenorphine 16 mg daily; change to 16 mg once in AM to help with side effect of insomnia. May take 12 mg in AM and 4 mg in PM if he develops withdrawal/cravings. -- Prescription provided x 1 week, # 14 films  -- Follow up 1 week

## 2017-05-23 NOTE — Telephone Encounter (Signed)
Pt calls and states his copay at [facility] is over 100.00 even with the discount card, I have notified other pharmacies to assess the cost: [facility] Or just a general discount card? Sending to dr Selena Battenkim also

## 2017-05-23 NOTE — Assessment & Plan Note (Signed)
Patient reports symptoms of GAD for which he has been self treating with illicit xanax for 23 years.  He admits to ongoing xanax use since discharge but has essentially tapered himself down to 0.5 mg every other day. Prior to hospitalization patient was taking 5-6 mg of xanax daily. He denies every withdrawing from benzo use. He reports a desire to stop taking benzodiazepines. We discussed treatment with SSRI/SNRI or Buspar and he is agreeable. Due to the increased risk of serotonin syndrome with starting both an SSRI and buspar concurrently, we will start with Buspar TID and add citalopram later if symptoms remain uncontrolled. Patient was counseled on cessation of xanax use. Psychiatry referral offered but patient refused.  -- Counseled on xanax cessation  -- Start Buspar 5 mg TID -- Consider adding citalopram in the next 1-2 weeks if symptoms uncontrolled  -- F/u 1 week

## 2017-05-24 ENCOUNTER — Telehealth: Payer: Self-pay | Admitting: Cardiology

## 2017-05-24 NOTE — Telephone Encounter (Signed)
Pt states he is asking his brother to pay this week and next week he will complete the pt assist paperwork

## 2017-05-24 NOTE — Telephone Encounter (Signed)
Pls call patient regarding medicine

## 2017-05-24 NOTE — Telephone Encounter (Signed)
Pt requesting a callback regarding medicine

## 2017-05-24 NOTE — Telephone Encounter (Signed)
It was a discount card from the manufacturer.  The films are more expensive, it could be changed to the tablets but he specifically requested the films.  Dr. Selena BattenKim may be able to help out here?  Thanks!

## 2017-05-25 NOTE — Telephone Encounter (Signed)
Ok the paperwork is ready, just need physician signature.  Thank you

## 2017-05-25 NOTE — Telephone Encounter (Signed)
Have spoken to pt, will see him tuesday

## 2017-05-25 NOTE — Telephone Encounter (Signed)
Okay.  That sounds like a plan.  Thank you.   Dr. Selena BattenKim - - If [name] comes to clinic next week, let's have him fill out the paperwork for the assistance plan.  I had thought we would wait 1-2 more weeks, but he seems to have a severe financial need.  Thanks.

## 2017-05-28 LAB — TOXASSURE SELECT,+ANTIDEPR,UR

## 2017-05-29 ENCOUNTER — Telehealth: Payer: Self-pay

## 2017-05-29 NOTE — Telephone Encounter (Signed)
Called pt, will get application for pt assist signed in am

## 2017-05-29 NOTE — Telephone Encounter (Signed)
I spoke w/ pt, he will be here in am, I have the paperwork for someone to sign in cliinic in am, spoke to [place]. also

## 2017-05-29 NOTE — Telephone Encounter (Signed)
Would like Helen to call back.  

## 2017-05-30 ENCOUNTER — Ambulatory Visit (INDEPENDENT_AMBULATORY_CARE_PROVIDER_SITE_OTHER): Payer: Self-pay | Admitting: Internal Medicine

## 2017-05-30 VITALS — BP 133/78 | HR 91 | Temp 98.3°F | Wt 175.5 lb

## 2017-05-30 DIAGNOSIS — Z79891 Long term (current) use of opiate analgesic: Secondary | ICD-10-CM

## 2017-05-30 DIAGNOSIS — F411 Generalized anxiety disorder: Secondary | ICD-10-CM

## 2017-05-30 DIAGNOSIS — Z720 Tobacco use: Secondary | ICD-10-CM

## 2017-05-30 DIAGNOSIS — F1120 Opioid dependence, uncomplicated: Secondary | ICD-10-CM

## 2017-05-30 DIAGNOSIS — M47816 Spondylosis without myelopathy or radiculopathy, lumbar region: Secondary | ICD-10-CM

## 2017-05-30 DIAGNOSIS — Z79899 Other long term (current) drug therapy: Secondary | ICD-10-CM

## 2017-05-30 MED ORDER — BUSPIRONE HCL 10 MG PO TABS: 5.0000 mg | ORAL_TABLET | Freq: Three times a day (TID) | ORAL | 1 refills | Status: DC

## 2017-05-30 MED ORDER — BUPRENORPHINE HCL-NALOXONE HCL 8-2 MG SL FILM: 2.0000 | ORAL_FILM | Freq: Every day | SUBLINGUAL | 0 refills | Status: DC

## 2017-05-30 MED ORDER — CITALOPRAM HYDROBROMIDE 10 MG PO TABS: 10.0000 mg | ORAL_TABLET | Freq: Every day | ORAL | 1 refills | Status: DC

## 2017-05-30 NOTE — Patient Instructions (Signed)
Omar Castillo,  It was a pleasure to see you today. I am glad you are doing well! For your anxiety, we have increased the dose of your Buspar to 10 mg three times daily. You may take 2 tablets of your current prescription three times a day until you run out and then start your new prescription. We have also added a second medication called Celexa (citalopram). Please take this once a day. Please continue to take your suboxone as previously prescribed. Follow up with [place] in 1 week. Thank you!  - Dr. Antony Contras

## 2017-05-30 NOTE — Progress Notes (Signed)
   05/30/2017  Omar Castillo presents for follow up of opioid use disorder I have reviewed the prior induction visit, follow up visits, and telephone encounters relevant to opiate use disorder (OUD) treatment.   Current daily dose: 16 mg daily    Date of Induction: 05/17/2017  Current follow up interval, in weeks: 1  The patient has been adherent with the buprenorphine for OUD contract.   Last UDS Result: Appropriate for Buprenorphine and metabolite. Inappropriate for xanax.   HPI: Patient is a 51 yo M with a history of severe opioid use disorder. He has been doing very well on 16 mg of suboxone daily since his last visit. His last UDS was appropriate for buprenorphine and metabolite. His dosing was changed to 2 flims of suboxone 8-2 mg once in the morning due to side effect of insomnia with BID dosing. Since this change, he has been doing very well. He denies any relapse and endorses only mild cravings that are transient in nature. He denies any illicit drug use aside from on going xanax use. Overall he feels well and is very happy with his suboxone treatment. He also reports improvement in his chronic back pain. He continues to be very grateful for his care.   At his last visit, buspar 5 mg TID was started for anxiety. He was previously taking 5-6 mg of illicit xanax use daily but tapered himself down to 0.5 mg daily after his overdose and hospitalization. He reports improvement in his anxiety with the buspar, and occasionally has taken 2 tablets (10 mg) when his anxiety is more severe. He continues to use low dose xanax but is no longer using daily. Last used yesterday.   [place] controlled substance database reviewed and refill history is appropriate.   Exam:   Vitals:   05/30/17 0910  BP: 133/78  Pulse: 91  Temp: 98.3 F (36.8 C)  TempSrc: Oral  SpO2: 98%  Weight: 175 lb 8 oz (79.6 kg)   Constitutional: NAD, appears comfortable HEENT: Normal oropharynx, moist mucous membranes    Cardiovascular: RRR, no murmurs, rubs, or gallops.  Pulmonary/Chest: CTAB, mild expiratory wheezing   Skin: No rashes or erythema  Psychiatric: Normal mood and affect  Assessment/Plan:  See Problem Based Charting in the Encounters Tab   Reymundo Poll, MD  05/30/2017  9:12 AM

## 2017-05-30 NOTE — Addendum Note (Signed)
Addended by: Burnell Blanks on: 05/30/2017 10:39 AM   Modules accepted: Orders

## 2017-05-30 NOTE — Progress Notes (Signed)
Internal Medicine Clinic Attending  I saw and evaluated the patient.  I personally confirmed the key portions of the history and exam documented by Dr. Antony Contras and I reviewed pertinent patient test results.  The assessment, diagnosis, and plan were formulated together and I agree with the documentation in the resident's note.  Patient is here for one-week follow-up of medication-assisted treatment of opioid use disorder. He is doing very well, tolerating Suboxone and seeing great benefits. Cravings are under control, he denies any illicit opioid use for 16 days. He reports the Suboxone is effective in managing his chronic lower back pain. Denies adverse side effects. He feels much more productive, and capable of caring for his mother who has stage IV lung cancer. Plan is to continue with Suboxone therapy at current dose. We are going to escalate medications for generalized anxiety disorder. I encouraged patient to seek out a Narcotics Anonymous meeting, which he has been considering. Follow-up in one week.

## 2017-05-30 NOTE — Assessment & Plan Note (Addendum)
Patient is a 51 yo M with a history of severe opioid use disorder. He has been doing very well on 16 mg of suboxone daily since his last visit. His last UDS was appropriate for buprenorphine and metabolite. His dosing was changed to 2 flims of suboxone 8-2 mg once in the morning due to side effect of insomnia with BID dosing. Since this change, he has been doing very well. He denies any relapse and endorses only mild cravings that are transient in nature. He denies any illicit drug use aside from on going xanax use. Overall he feels well and is very happy with his suboxone treatment. He also reports improvement in his chronic back pain. He continues to be very grateful for his care. [place] controlled substance database reviewed today and refill history is appropriate. -- Continue Suboxone 8-2 mg, 2 films once daily in AM -- Provided 1 week prescription, #14 films  -- F/u UDS -- F/u 1 week   ADDENDUM: UDS results reviewed and are appropriate aside from illicit xanax use which was discussed during the visit.

## 2017-05-30 NOTE — Assessment & Plan Note (Signed)
History of chronic generalized anxiety disorder for which patient has been self treating with illicit xanax use for 23 years. At his last visit, buspar 5 mg TID was started for anxiety. He was previously taking 5-6 mg of illicit xanax use daily but tapered himself down to 0.5 mg daily after his overdose and hospitalization. He reports improvement in his anxiety with the buspar, and occasionally has taken 2 tablets (10 mg) when his anxiety is more severe. He continues to use low dose xanax but is no longer using daily. Last used yesterday. He also reports being treated with Prozac many years ago prior to the start of his illicit substance use which he did very well on. Today we discussed restarting a low dose SSRI and he is agreeable. He was counseled on risks and benefits and advised that the medication will take 4-6 weeks to take effect.  -- Increase buspar to 10 mg TID -- Start citalopram 10 mg daily -- Encourage xanax cessation  -- Follow up 1 week

## 2017-06-04 LAB — TOXASSURE SELECT,+ANTIDEPR,UR

## 2017-06-05 NOTE — Progress Notes (Signed)
Internal Medicine Clinic Attending  I saw and evaluated the patient.  I personally confirmed the key portions of the history and exam documented by Dr. Antony Contras and I reviewed pertinent patient test results.  The assessment, diagnosis, and plan were formulated together and I agree with the documentation in the resident's note.  Omar Castillo was very pleasant today, doing well on suboxone for his moderate OUD.  Database appropriate.  Urine tox screen today.  He reports some insomnia with BID dosing of suboxone and was advised to take once per day.  We discussed his anxiety and will attempt Buspar on him.  Advised to no longer take substances not prescribed by a physician.  He will follow up in 1 week.

## 2017-06-06 ENCOUNTER — Ambulatory Visit (INDEPENDENT_AMBULATORY_CARE_PROVIDER_SITE_OTHER): Payer: Self-pay | Admitting: Internal Medicine

## 2017-06-06 ENCOUNTER — Ambulatory Visit: Payer: Self-pay

## 2017-06-06 VITALS — BP 145/94 | HR 90 | Temp 98.5°F | Resp 20 | Wt 176.9 lb

## 2017-06-06 DIAGNOSIS — F411 Generalized anxiety disorder: Secondary | ICD-10-CM

## 2017-06-06 DIAGNOSIS — F1120 Opioid dependence, uncomplicated: Secondary | ICD-10-CM

## 2017-06-06 DIAGNOSIS — Z6379 Other stressful life events affecting family and household: Secondary | ICD-10-CM

## 2017-06-06 MED ORDER — BUPRENORPHINE HCL-NALOXONE HCL 8-2 MG SL FILM: 1.0000 | ORAL_FILM | Freq: Two times a day (BID) | SUBLINGUAL | 0 refills | Status: DC

## 2017-06-06 NOTE — Progress Notes (Signed)
Internal Medicine Clinic Attending  I saw and evaluated the patient.  I personally confirmed the key portions of the history and exam documented by Dr. Antony Contras and I reviewed pertinent patient test results.  The assessment, diagnosis, and plan were formulated together and I agree with the documentation in the resident's note.  51 year old man now 24 days sober on medication assisted treatment of opioid use disorder. He is stable now, did well through induction, and working into remission. Having many social stressors right now, mother is ill and admitted to the hospital, having trouble getting the suboxone covered through an assistance program. Currently paying $48 for a week supply with a coupon, and relying on family for this funding. Current dose is 2 films daily, which is controlling the cravings. We may have to consider switching to a buprenorphine only product, which is available on some $4 lists.

## 2017-06-06 NOTE — Assessment & Plan Note (Signed)
Patient has noticed significant improvement in his anxiety since increasing his Buspar dosing to 10 mg TID and since start citalopram 10 mg daily. GAD-7 score today of 3. His mother has been sick in the hospital and this has been a significant stressor for him over the past week. He admits to using illicit xanax once since his last visit (0.5 mg on 06/04/17). However, aside from that reports he has been doing very well abstaining from illicit benzo use on his new regimen.  -- Continue Buspar 10 mg TID -- Continue Citalopram 10 mg daily  -- Encourage abstinence  -- F/u 1 week

## 2017-06-06 NOTE — Patient Instructions (Addendum)
Omar Castillo,  It was a pleasure to see you today. I am glad you are doing well! Please continue to take your medicine as previously prescribed. Please follow up with [place] in 1 week. Thank you!  - Dr. Antony Contras

## 2017-06-06 NOTE — Assessment & Plan Note (Addendum)
Patient is a 51 yo M with a history of severe opioid use disorder. He has been doing very well on 16 mg of suboxone daily. His last UDS was appropriate for buprenorphine and metabolite. He currently takes both films in the morning due to side effect of insomnia with BID dosing, but tolerates the once a day dose very well. He denies any relapse and endorses infrequent cravings that are transient in nature. Overall he is very happy with suboxone treatment and continues to be very grateful for his care.  controlled substance database reviewed today and refill history is appropriate.  -- Continue Suboxone 8-2 mg, 2 films once daily in AM -- Provided 1 week prescription, #14 films -- F/u UDS -- F/u 1 week   ADDENDUM: UDS appropriate for buprenorphine and metabolite. Inappropriate for xanax which was already addressed this visit.

## 2017-06-06 NOTE — Progress Notes (Signed)
   06/06/2017  Omar Castillo presents for follow up of opioid use disorder I have reviewed the prior induction visit, follow up visits, and telephone encounters relevant to opiate use disorder (OUD) treatment.   Current daily dose: 16 mg daily  Date of Induction: 05/17/2017  Current follow up interval, in weeks: 1  The patient has been adherent with the buprenorphine for OUD contract.   Last UDS Result: Appropriate for buprenorphine and metabolite. Inappropriate for xanax which was discussed during his last visit.   HPI: 51 yo M with a history of severe opioid use disorder. He has been doing very well on 16 mg of suboxone daily since his last visit. He currently takes both films in the morning due to side effect of insomnia with BID dosing but does very well with the once a day dosing. He denies any relapse and endorses only infrequent, transient cravings. Overall he is very happy with his suboxone therapy. He does admit to taking illicit xanax once since his last visit. He reports taking 0.5 mg of xanax on Saturday 9/23. His mother has been sick in the hospital and this has been a signficant source of stress for the patient. However aside from this, he reports significant improvement in his anxiety overall since increasing the dose of buspar to 10 mg TID and starting citalopram 10 mg daily. He reports he has otherwise been doing well abstaining fro benzo use. GAD-7 score today of only 3.   [place] controlled substance database was reviewed today and appropriate.   Exam:   Vitals:   06/06/17 0937  BP: (!) 145/94  Pulse: 90  Resp: 20  Temp: 98.5 F (36.9 C)  TempSrc: Oral  SpO2: 97%  Weight: 176 lb 14.4 oz (80.2 kg)    Physical Exam Constitutional: NAD, appears comfortable Cardiovascular: RRR Pulmonary/Chest: CTAB Skin: No rashes or erythema  Psychiatric: Normal mood and affect  Assessment/Plan:  See Problem Based Charting in the Encounters Tab   Reymundo Poll, MD  06/06/2017   10:09 AM

## 2017-06-12 LAB — TOXASSURE SELECT,+ANTIDEPR,UR

## 2017-06-13 ENCOUNTER — Ambulatory Visit (INDEPENDENT_AMBULATORY_CARE_PROVIDER_SITE_OTHER): Payer: Self-pay | Admitting: Internal Medicine

## 2017-06-13 DIAGNOSIS — F411 Generalized anxiety disorder: Secondary | ICD-10-CM

## 2017-06-13 DIAGNOSIS — F1120 Opioid dependence, uncomplicated: Secondary | ICD-10-CM

## 2017-06-13 DIAGNOSIS — Z79899 Other long term (current) drug therapy: Secondary | ICD-10-CM

## 2017-06-13 DIAGNOSIS — Z809 Family history of malignant neoplasm, unspecified: Secondary | ICD-10-CM

## 2017-06-13 MED ORDER — BUPRENORPHINE HCL-NALOXONE HCL 8-2 MG SL FILM: 1.0000 | ORAL_FILM | Freq: Two times a day (BID) | SUBLINGUAL | 0 refills | Status: DC

## 2017-06-13 NOTE — Progress Notes (Signed)
Pt's 1st void was qty insufficient at 10ml.  Pt given water, and voided a 2nd time @ 1003. 2nd collection sent to lab for testing and  1st collection was discarded.Criss Alvine, Darlene Cassady10/2/201810:09 AM

## 2017-06-13 NOTE — Assessment & Plan Note (Signed)
Moderately well controlled.  Continues to have stressful days and takes a Xanax to cope. Feels the celexa and buspar are helping though.   Plan Continue Celexa  Continue Buspar  TID Cease Xanax use Consider increasing Buspar to  TID at next visit as he is tolerating the combo well.

## 2017-06-13 NOTE — Assessment & Plan Note (Signed)
Cravings are well controlled, per UDS he is taking the medication as prescribed.  He is very happy with his sobriety of 30 days and feels like he is making progress.   Plan Continue Suboxone -  BID, dispense 14 film today.   Return in 1 week I advised him to see out another NA meeting which may be more supportive of MAT

## 2017-06-13 NOTE — Patient Instructions (Signed)
Mr. Gahan - -  It was a pleasure to see you today!    Please keep taking your medication as prescribed.  We are going to work on getting you approved for free suboxone.   Come back to see [place] in 1 week.

## 2017-06-13 NOTE — Progress Notes (Signed)
   06/13/2017  Omar Castillo presents for follow up of opioid use disorder I have reviewed the prior induction visit, follow up visits, and telephone encounters relevant to opiate use disorder (OUD) treatment.   Current daily dose:  daily  Date of Induction: 05/17/2017.  Current follow up interval, in weeks: 1 week  The patient has not been adherent with the buprenorphine for OUD contract.  Discussed at last visit, used illicit xanax because of anxiety around a family illness  Last UDS Result: Buprenorphine, metabolite and xanax  HPI: Omar Castillo is doing well today.  He reports that his cravings are controlled on current dose of suboxone.  He notes that his anxiety is beginning to be better controlled on Celexa and Buspar.  He does admit to taking one xanax on Sunday which he obtained previous to starting treatment.  He has not bought anything off the street in 30 days and he is excited to be off heroin for 30 days.  He did attend an NA meeting on Sunday.  However, some of the members at the meeting starting speaking negatively about Suboxone use as a "crutch."  This greatly upset him as he feels he has only been successful due to the medication.  He apparently got up and stated "I take suboxone and you can kiss my ass." and left the meeting.  He then felt anxious and took a xanax that day (Sunday 9/30).  He reports that his mother is doing better, but has stage 4 cancer so her health is up and down.  He is very committed to sobriety.  He has been having monetary issues.  He is not interested in changing therapy right now and thinks he can afford 1 more week of therapy with Suboxone.  He is worried a different medication would not help him as much.  I explained that the buprenorphine alone formulation would only be a stop gap, but he seems like he has the funds for this week.  He will see [place] again in 1 week.  I reviewed the  narcotic database and it was appropriate.  He will given [place] a urine  sample this week.    We discussed work options now that he is sober.  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  He is trying to get this back.  I am not sure if the licensing board would have a problem with him using Suboxone, but advised him that if that were a problem, he should discuss with [place] and we can assist if needed.  Possibly naltrexone would be an option in that case.    Exam:   Vitals:   06/13/17 0939  BP: (!) 160/89  Pulse: 89  Temp: 98.1 F (36.7 C)  TempSrc: Oral  SpO2: 99%  Weight: 178 lb 12.8 oz (81.1 kg)    Gen: Well dressed, NAD, comfortable, conversant Pulm: Breathing comfortably, no audible wheezing Skin: No wounds or skin breakdown Psych: Pleasant, normal mood.   Assessment/Plan:  See Problem Based Charting in the Encounters Tab     [name], MD  06/13/2017  9:46 AM

## 2017-06-15 ENCOUNTER — Encounter: Payer: Self-pay | Admitting: Pharmacist

## 2017-06-15 NOTE — Progress Notes (Unsigned)
Patient was approved for suboxone patient assistance program x 1 year.

## 2017-06-17 LAB — TOXASSURE SELECT,+ANTIDEPR,UR

## 2017-06-20 ENCOUNTER — Ambulatory Visit (INDEPENDENT_AMBULATORY_CARE_PROVIDER_SITE_OTHER): Payer: Self-pay | Admitting: Student in an Organized Health Care Education/Training Program

## 2017-06-20 VITALS — BP 137/93 | HR 86 | Temp 98.3°F | Resp 20 | Ht 67.0 in | Wt 168.7 lb

## 2017-06-20 DIAGNOSIS — F411 Generalized anxiety disorder: Secondary | ICD-10-CM

## 2017-06-20 DIAGNOSIS — F1120 Opioid dependence, uncomplicated: Secondary | ICD-10-CM

## 2017-06-20 DIAGNOSIS — Z79899 Other long term (current) drug therapy: Secondary | ICD-10-CM

## 2017-06-20 MED ORDER — BUPRENORPHINE HCL-NALOXONE HCL 8-2 MG SL FILM: ORAL_FILM | SUBLINGUAL | 0 refills | Status: DC

## 2017-06-20 MED ORDER — CITALOPRAM HYDROBROMIDE 20 MG PO TABS: 20.0000 mg | ORAL_TABLET | Freq: Every day | ORAL | 1 refills | Status: DC

## 2017-06-20 NOTE — Assessment & Plan Note (Signed)
Anxiety symptoms are improving, but he still has uncontrolled anxiety around the time of the week he has to ask for money to afford his suboxone prescription. This leads to taking an illicit xanax. Hopefully this will resolve now that he has approval for free suboxone for one year. Plan is to also increase citalopram to  daily and contibue Buspar  TID.

## 2017-06-20 NOTE — Progress Notes (Signed)
   06/20/2017  Omar Castillo presents for follow up of opioid use disorder I have reviewed the prior induction visit, follow up visits, and telephone encounters relevant to opiate use disorder (OUD) treatment.   Current daily dose: Suboxone  daily  Date of Induction: 05/17/17  Current follow up interval, in weeks: 1  The patient has been adherent with the buprenorphine for OUD contract.   Last UDS Result: Inappropriate for small amount of xanax, Bupe present  HPI: 51 year old man here for follow-up of opioid use disorder.he is currently doing very well and reports complete abstinence from illicit opioids for over one month. He says that he feels much better. His mom was recently discharged the hospital and was returned home which is been very good for his mood. He still struggles with anxiety disorder, he says the citalopram and BuSpar are helping and control his symptoms most of the week. However towards the end of the week he starts to get anxious about finding enough money to afford his next Suboxone prescription. This led him to take 1 pill of Xanax on Sunday. He also reporting that he is having some nighttime cravings for opioids, 2 evenings last week he took an extra strip of Suboxone which led him to run out a little early. Denies any withdrawal symptoms. No fevers or chills. No other side effects. He is looking for a new job, going to apply at the [place] [employer] which is being renovated into shops and apartments, this is close to his house and so would be very easy transportation. He was very pleased to learn today that he was approved for Suboxone assistance from the pharmaceutical company we gave him the phone number to call to receive his approval number which he can get the pharmacy and should have free Suboxone prescriptions for the next 1 year.   Exam:   Vitals:   06/20/17 0939  BP: (!) 137/93  Pulse: 86  Resp: 20  Temp: 98.3 F (36.8 C)  TempSrc: Oral  SpO2: 99%    Weight: 168 lb 11.2 oz (76.5 kg)  Height:  (1.702 m)    Gen: well appearing, comfortable, no distress ENT: fair dentition Psych:  Positive affect, normal mood, smiles, well groomed, makes eye contact, affable man Neuro: Conversational, oriented, normal strength and gait.  Assessment/Plan:  See Problem Based Charting in the Encounters Tab   [name], MD  06/20/2017  10:12 AM

## 2017-06-20 NOTE — Patient Instructions (Signed)
1. You have been approved for a one year supply of suboxone from the drug company. We gave you the phone number you can call to get your approval number. This should allow you to receive your prescription for free today.   2. We are increasing your suboxone to 2 and one half films daily, to help with your night time cravings.   3. We are increasing the Celexa to  daily to help with the anxiety. It is important to stop using xanax as it can interact with the suboxone.

## 2017-06-20 NOTE — Assessment & Plan Note (Signed)
51 year old man with severe opioid use disorder modestly well controlled on medication assisted therapy. Plan is to increase Suboxone to 20 mg daily because of nighttime cravings that he is having.he reports complete abstinence from opioids since he was started on Suboxone on September 5, which is over 1 month. I congratulated him on this. U tox has been appropriate, and again collected today. Database is appropriate. Follow up planned for 1 week. If he can stop using illicit xanax, he is close to being able to follow up every other week.

## 2017-06-26 LAB — TOXASSURE SELECT,+ANTIDEPR,UR

## 2017-06-27 ENCOUNTER — Ambulatory Visit (INDEPENDENT_AMBULATORY_CARE_PROVIDER_SITE_OTHER): Payer: Self-pay | Admitting: Internal Medicine

## 2017-06-27 DIAGNOSIS — F1120 Opioid dependence, uncomplicated: Secondary | ICD-10-CM

## 2017-06-27 DIAGNOSIS — Z79899 Other long term (current) drug therapy: Secondary | ICD-10-CM

## 2017-06-27 DIAGNOSIS — F411 Generalized anxiety disorder: Secondary | ICD-10-CM

## 2017-06-27 MED ORDER — BUPRENORPHINE HCL-NALOXONE HCL 8-2 MG SL FILM: ORAL_FILM | SUBLINGUAL | 0 refills | Status: DC

## 2017-06-27 NOTE — Assessment & Plan Note (Signed)
Reports that he is doing better on the higher dose of citalopram, but still used illicit xanax.  I think he may eventually need psychotherapy, but he cannot access at this po [id number]$20mg

## 2017-06-27 NOTE — Patient Instructions (Signed)
Omar Castillo - -  It was very good to see you today!  We discussed getting into an NA meeting that fits your personality to find some support in new friends.    Please come back to see me in 1 week.

## 2017-06-27 NOTE — Assessment & Plan Note (Signed)
His UDS showed short acting opiate use, he denies.  He reports not taking any unknown pills.  I will give him another chance with UDS today.  If continues to show short acting opiates, will need further discussion about possible sources.   He used cocaine which he was forthright about.  We discussed at length ways to remain sober going forward.   Plan Continue Suboxone 8-2mg  BID and half a film at night Consider increasing to TID if cravings are still bad at night at next visit.

## 2017-06-27 NOTE — Progress Notes (Signed)
   06/27/2017  Omar Castillo presents for follow up of opioid use disorder I have reviewed the prior induction visit, follow up visits, and telephone encounters relevant to opiate use disorder (OUD) treatment.   Current daily dose: 4m/day  Date of Induction: 05/17/17  Current follow up interval, in weeks: 1 week  The patient has not been adherent with the buprenorphine for OUD contract.   Last UDS Result: + for buprenorphine, xanax and opiates  HPI: [name] a 89yoman here for follow up of his OUD.  He has been doing well, however, he had a slip up over the weekend.  He reports no further opiate use (last UDS appears he did have opiates, however, he denies), but he met some new people at a job site and was invited to their house where he was offered cocaine, which he did.  He feels very remorseful about this and was worried about being kicked out of the clinic.  We discussed at length and I recommended that he re-commit to sobriety and find an NA meeting which is more supportive for him.  He reports that his financial anxiety is better now that his suboxone is paid for, but his mother was in the hospital over the weekend and his power was out.  Without her support, he struggled.  We discussed seeking out a counselor, but he cannot access this service at this time due to money and transportation.  He states that he is going to quit work to focus on himself now that his financial situation is better.  I again encouraged him to cease the illicit xanax use.  He feels the citalopram is starting to help him more.    [place] narcotic database appropriate.  UDS today.  Exam:   There were no vitals filed for this visit.  Gen: Alert, oriented, pleasant Eyes: Anictertic sclerae, no conjunctival injection CV: RR, NR, no murmur Pulm: No audible wheezing, breathing comfortably Abd: Soft, +BS Ext: No track marks, no rash.   Assessment/Plan:  See Problem Based Charting in the Encounters  Tab     Omar Falcon MD  06/27/2017  9:05 AM

## 2017-07-03 LAB — TOXASSURE SELECT,+ANTIDEPR,UR

## 2017-07-04 ENCOUNTER — Ambulatory Visit (INDEPENDENT_AMBULATORY_CARE_PROVIDER_SITE_OTHER): Payer: Self-pay | Admitting: Internal Medicine

## 2017-07-04 DIAGNOSIS — F1120 Opioid dependence, uncomplicated: Secondary | ICD-10-CM

## 2017-07-04 DIAGNOSIS — F411 Generalized anxiety disorder: Secondary | ICD-10-CM

## 2017-07-04 DIAGNOSIS — Z79899 Other long term (current) drug therapy: Secondary | ICD-10-CM

## 2017-07-04 MED ORDER — BUSPIRONE HCL 10 MG PO TABS
15.0000 mg | ORAL_TABLET | Freq: Three times a day (TID) | ORAL | 1 refills | Status: DC
Start: 2017-07-04 — End: 2017-08-11

## 2017-07-04 MED ORDER — BUPRENORPHINE HCL-NALOXONE HCL 8-2 MG SL FILM: ORAL_FILM | SUBLINGUAL | 0 refills | Status: DC

## 2017-07-04 NOTE — Addendum Note (Signed)
Addended by: Rocco PaulsATKINSON, HELEN L on: 07/04/2017 10:57 AM   Modules accepted: Orders

## 2017-07-04 NOTE — Assessment & Plan Note (Signed)
Continues to be uncontrolled.  He is taking Buspar 10mg  TID and Citalopram 20mg  daily.  He reports adherence to this regimen.  He has anxiety regarding his mothers health and finances.  He continues to take illicit xanax 1-2 times per week.   Plan Increase Buspar to 15mg  TID (max dose is 60mg /day in divided doses) Continue Citalopram 20mg  daily [facility] provided to seek out counseling.

## 2017-07-04 NOTE — Progress Notes (Signed)
   07/04/2017  Omar Castillo presents for follow up of opioid use disorder I have reviewed the prior induction visit, follow up visits, and telephone encounters relevant to opiate use disorder (OUD) treatment.   Current daily dose: 18mg /day  Date of Induction: 05/17/17  Current follow up interval, in weeks: 1 week  The patient has not been adherent with the buprenorphine for OUD contract.   Last UDS Result: + for likely heroin and buprenorphine  HPI: Omar Castillo is a 51yo man here for follow up of his OUD.  At last visit, he had a relapse where he thought he took cocaine from some people at work.  Based on his UDS this was likely heroin.  I told him about this and he was very down on himself.  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  Unfortunately, he continues to have uncontrolled anxiety.  His mother (main support) is back in the hospital and on the day she was admitted (Thursday 10/18) he admits to taking a Xanax illicitly.  He reports that the medications Citalopram and Buspar are starting to help, but they seem to not work as well, or quickly as the Xanax.  Today, we discussed increasing his dose of Buspar.  We also discussed him seeking out [facility]  I am not sure what his co-pay would be, but I advised him to call them and inquire about services.  Otherwise, he denies any further illicit substance use.  He has not sought out another NA meeting.  He does report that he continues to have cravings mainly at night, he copes with eating and distracting himself.  His blood pressure was elevated today.  Will check again at next visit, if continues to be elevated may advise a BP medication.    Exam:   Vitals:   07/04/17 0849  BP: (!) 157/93  Pulse: 79  Resp: 20  Temp: 98.3 F (36.8 C)  TempSrc: Oral  SpO2: 98%  Weight: 183 lb 9.6 oz (83.3 kg)  Height: 5\' 7"  (1.702 m)     General: Alert, well dressed, conversant Eyes: Anicteric sclerae, no conjunctival injeciton Resp: Breathing comfortably, no audible wheezing Skin: No track marks on visible skin, no rash.   Psych: Positive attitude, but frustrated with continued cravings for opiates.  Assessment/Plan:  See Problem Based Charting in the Encounters Tab     Inez CatalinaMullen, Brunilda Eble B, MD  07/04/2017  9:23 AM

## 2017-07-04 NOTE — Assessment & Plan Note (Signed)
He is taking his buprenorphine-naloxone without issue.  He has continued cravings at night, but I think this may be more related to his anxiety.  He has a strong anxiety disorder which is making sobriety difficult for him.  He likely was exposed to heroin and not cocaine last weekend, which he did self report.  He will remain on 1 week visits until Urine Tox is clean of illicit substances.   Plan Continue Buprenorphine 2.5 films/day Anxiety plan as noted in GAD problem Follow up in 1 week.

## 2017-07-04 NOTE — Patient Instructions (Signed)
Shail - -  Thank you for coming in today.    For your anxiety, keep working to stop taking the xanax as you have been doing.  I hope to hear that your mom is doing better next time we see you!  For your anxiety, Please increase your Buspar to 15mg  (1.5 tablets) three times per day.  The next time you need a refill we can increase the tablet size to 15mg  tablets.  There is a small chance of something called serotonin syndrome with the medications you are on.  I am going to give you information about the symptoms of this below.  Please let us know immediately if you develop any of these symptoms.   I am also giving you information about [facility].  Given them a call to see about counseling regarding the stressors you are having.  This may help.  Let me know if they cannot provide you services b/c of your insurance.   Thank you!  Come back to see me in 1 week.   Serotonin Syndrome Serotonin is a brain chemical that regulates the nervous system, which includes the brain, spinal cord, and nerves. Serotonin appears to play a role in all types of behavior, including appetite, emotions, movement, thinking, and response to stress. Excessively high levels of serotonin in the body can cause serotonin syndrome, which is a very dangerous condition. What are the causes? This condition can be caused by taking medicines or drugs that increase the level of serotonin in your body. These include:  Antidepressant medicines.  Migraine medicines.  Certain pain medicines.  Certain recreational drugs, including ecstasy, LSD, cocaine, and amphetamines.  Over-the-counter cough or cold medicines that contain dextromethorphan.  Certain herbal supplements, including St. John's wort, ginseng, and nutmeg.  This condition usually occurs when you take these medicines or drugs in combination, but it can also happen with a high dose of a single medicine or drug. What increases the risk? This  condition is more likely to develop in:  People who have recently increased the dosage of medicine that increases the serotonin level.  People who just started taking medicine that increases the serotonin level.  What are the signs or symptoms? Symptoms of this condition usually happens within several hours of a medicine change. Symptoms include:  Headache.  Muscle twitching or stiffness.  Diarrhea.  Confusion.  Restlessness or agitation.  Shivering or goose bumps.  Loss of muscle coordination.  Rapid heart rate.  Sweating.  Severe cases of serotonin syndromecan cause:  Irregular heartbeat.  Seizures.  Loss of consciousness.  High fever.  How is this diagnosed? This condition is diagnosed with a medical history and physical exam. You will be asked aboutyour symptoms and your use of medicines and recreational drugs. Your health care provider may also order lab work or additional tests to rule out other causes of your symptoms. How is this treated? The treatment for this condition depends on the severity of your symptoms. For mild cases, stopping the medicine that caused your condition is usually all that is needed. For moderate to severe cases, hospitalization is required to monitor you and to prevent further muscle damage. Follow these instructions at home:  Take over-the-counter and prescription medicines only as told by your health care provider. This is important.  Check with your health care provider before you start taking any new prescriptions, over-the-counter medicines, herbs, or supplements.  Avoid combining any medicines that can cause this condition to occur.  Keep all follow-up visits as told by your health care provider.This is important.  Maintain a healthy lifestyle. ? Eat healthy foods. ? Get plenty of sleep. ? Exercise regularly. ? Do not drink alcohol. ? Do not use recreational drugs. Contact a health care provider if:  Medicines do not  seem to be helping.  Your symptoms do not improve or they get worse.  You have trouble taking care of yourself. Get help right away if:  You have worsening confusion, severe headache, chest pain, high fever, seizures, or loss of consciousness.  You have serious thoughts about hurting yourself or others.  You experience serious side effects of medicine, such as swelling of your face, lips, tongue, or throat. This information is not intended to replace advice given to you by your health care provider. Make sure you discuss any questions you have with your health care provider. Document Released: 10/06/2004 Document Revised: 04/23/2016 Document Reviewed: 09/11/2014 Elsevier Interactive Patient Education  [employer].

## 2017-07-09 LAB — TOXASSURE SELECT,+ANTIDEPR,UR

## 2017-07-11 ENCOUNTER — Ambulatory Visit (INDEPENDENT_AMBULATORY_CARE_PROVIDER_SITE_OTHER): Payer: Self-pay | Admitting: Internal Medicine

## 2017-07-11 VITALS — BP 127/73 | HR 87 | Temp 98.1°F | Resp 20 | Wt 181.2 lb

## 2017-07-11 DIAGNOSIS — F17210 Nicotine dependence, cigarettes, uncomplicated: Secondary | ICD-10-CM

## 2017-07-11 DIAGNOSIS — F411 Generalized anxiety disorder: Secondary | ICD-10-CM

## 2017-07-11 DIAGNOSIS — F1120 Opioid dependence, uncomplicated: Secondary | ICD-10-CM

## 2017-07-11 DIAGNOSIS — Z79899 Other long term (current) drug therapy: Secondary | ICD-10-CM

## 2017-07-11 DIAGNOSIS — Z720 Tobacco use: Secondary | ICD-10-CM

## 2017-07-11 MED ORDER — BUPRENORPHINE HCL-NALOXONE HCL 8-2 MG SL FILM: 1.0000 | ORAL_FILM | Freq: Three times a day (TID) | SUBLINGUAL | 0 refills | Status: DC

## 2017-07-11 NOTE — Patient Instructions (Signed)
Ruthvik - -  You are doing great!  Keep up the good work.  You will come back to see us in 1 week.  If things are going well, we can increase you to 2 week visits.

## 2017-07-11 NOTE — Progress Notes (Signed)
   07/11/2017  Harlan StainsJohnny L Brannigan presents for follow up of opioid use disorder I have reviewed the prior induction visit, follow up visits, and telephone encounters relevant to opiate use disorder (OUD) treatment.   Current daily dose: 18mg /day  Date of Induction: 05/17/17  Current follow up interval, in weeks: 1 week  The patient has not been adherent with the buprenorphine for OUD contract.   Last UDS Result: Xanax (reported), waning heroin (likely used 2 weeks prior), buprenorphine with metabolite  HPI: Mr. [name] is a 51yo man with moderate OUD.  He has been in treatment for about 7 weeks with us.  His main issue continues to be illicit use of Xanax for his anxiety issues.  Last week we discussed counseling and made changes to his buspirone dose to help with anxiety.  He reports that this dose has been helping.  It is also helping that his mother is healthy at the moment and is home from the hospital.  He is looking forward to the holidays.  He has not taken any illicit xanax per him.  He has called [facility] ([facility] and believes he will have an appointment next week.  He feels this will be of benefit to him.  He continues to have cravings at night.  We discussed the pros and cons of going up to 3 films per day and he thinks this may help.  If he develops insomnia or other side effects he will go back to 2.5 films per day.  Overall improved.  I think his mothers health is a huge stressor for him.  I would like to see him in psychological care to assist with coping skills if she were to be hospitalized again.    UDS today. [place] narcotic database was appropriate when checked today.      Exam:   Vitals:   07/11/17 0948  BP: 127/73  Pulse: 87  Resp: 20  Temp: 98.1 F (36.7 C)  TempSrc: Oral  SpO2: 97%  Weight: 181 lb 3.2 oz (82.2 kg)    General: Well developed, No acute distress CV: RR, NR, no murmur Pulm: CTAB, no wheezing Ext: Mild scratches on hands, no  swelling Psych: Pleasant, good mood, conversant.    Assessment/Plan:  See Problem Based Charting in the Encounters Tab     Inez CatalinaMullen, Naia Ruff B, MD  07/11/2017  9:52 AM

## 2017-07-12 NOTE — Assessment & Plan Note (Signed)
I began a discussion of cessation with him today.  He is precontemplative.  I think once he is stable on his dose of buprenorphine-naloxone, this could be further addressed.

## 2017-07-12 NOTE — Assessment & Plan Note (Signed)
Seems well managed at this time.  He notes that the increased dose of Buspirone at 15mg  TID is helping.  He is also seeking out psychology which I encouraged for him.    Plan Continue Buspar and Celexa at current doses Follow up with Psychology We set a goal of no further illicit xanax use.

## 2017-07-12 NOTE — Assessment & Plan Note (Signed)
He is doing well but continues to have cravings at night.  He would like to try TID dosing which we will do today.  His UDS was reviewed.  He continues to use intermittent illicit xanax, which we discussed today.  He will need to have a few weeks of appropriate UDS prior to going to every 2 week visits.    Plan Increase Buprenorphine-naloxone to 8-2mg  SL film TID UDS today Return in 1 week

## 2017-07-15 LAB — TOXASSURE SELECT,+ANTIDEPR,UR

## 2017-07-18 ENCOUNTER — Ambulatory Visit (INDEPENDENT_AMBULATORY_CARE_PROVIDER_SITE_OTHER): Payer: Self-pay | Admitting: Internal Medicine

## 2017-07-18 VITALS — BP 127/89 | HR 93 | Temp 98.4°F | Wt 184.7 lb

## 2017-07-18 DIAGNOSIS — F1120 Opioid dependence, uncomplicated: Secondary | ICD-10-CM

## 2017-07-18 MED ORDER — BUPRENORPHINE HCL-NALOXONE HCL 8-2 MG SL FILM: 1.0000 | ORAL_FILM | Freq: Three times a day (TID) | SUBLINGUAL | 0 refills | Status: DC

## 2017-07-18 NOTE — Assessment & Plan Note (Addendum)
Assessment:  Opioid use disorder Patient reports using buprenorphine, 2 films in the morning and 1 film in the afternoon.  He states that this regimen currently curbs his cravings and helps keep him level.  When asked about inappropriate UDS showing fentanyl, patient stated he has been using heroin once a week.  He states he is working on discontinuing illicit drug use and states that buprenorphine is helping.    UDS 10/30 reviewed.  [place] narcotic database appropriate.  Plan -continue buprenorphine-naloxone 8-2mg  SL film TID -UDS today - return in 1 week

## 2017-07-18 NOTE — Progress Notes (Signed)
   07/18/2017  Omar Castillo presents for follow up of opioid use disorder I have reviewed the prior induction visit, follow up visits, and telephone encounters relevant to opiate use disorder (OUD) treatment.   Current daily dose: 24mg /day  Date of Induction: 05/17/17  Current follow up interval, in weeks: 1 week  The patient has not been adherent with the buprenorphine for OUD contract.   Last UDS Result: 10/30:  Buprenorphine, alprazolam, fentanyl  HPI:  [name] is a 51 year old gentleman with OUD. He has been in treatment for the past 8 weeks with the internal medicine Suboxone clinic. He states that he uses 2 films of the buprenorphine in the morning and one film in the afternoon. He states that this current regimen curbs his cravings and helps keep him level. He denies withdrawal symptoms or side effects from the increase in buprenorphine since last visit. He reported that he relapsed on Saturday with Xanax and oxycodone. He states that the substances were available to him to help with hand pain after work.  He reports that his main stressor is his mother's health who has stage IV lung cancer on immunotherapy.  He stated he called [facility] ([facility]) and will be going this week to seek counseling.     Exam:   Vitals:   07/18/17 0920  BP: 127/89  Pulse: 93  Temp: 98.4 F (36.9 C)  TempSrc: Oral  SpO2: 99%  Weight: 184 lb 11.2 oz (83.8 kg)    Physical Exam  Constitutional: He is well-developed, well-nourished, and in no distress. No distress.  Cardiovascular: Normal rate, regular rhythm and normal heart sounds. Exam reveals no gallop and no friction rub.  No murmur heard. Pulmonary/Chest: Effort normal and breath sounds normal. No respiratory distress. He has no wheezes. He has no rales.  Skin: He is not diaphoretic.     Assessment/Plan:  See Problem Based Charting in the Encounters Tab     Camelia PhenesHoffman, Omar Belle Ratliff, DO  07/18/2017  9:37  AM

## 2017-07-18 NOTE — Patient Instructions (Signed)
[name],  It was a pleasure meeting you today.  Please follow up in one week.

## 2017-07-19 NOTE — Progress Notes (Signed)
Internal Medicine Clinic Attending  I saw and evaluated the patient.  I personally confirmed the key portions of the history and exam documented by Dr. Mikey BussingHoffman and I reviewed pertinent patient test results.  The assessment, diagnosis, and plan were formulated together and I agree with the documentation in the resident's note.  Moderate OUD currently in relapse for the last three weeks, with heroin use at home about once weekly according to the patient. He reports good compliance with suboxone, and good control of cravings at current dosing. He thinks availability and social stressors are leading him to use. We set a goal of no illicit opioids over the next week, plan to continue with current suboxone dosing.

## 2017-07-24 LAB — TOXASSURE SELECT,+ANTIDEPR,UR

## 2017-07-25 ENCOUNTER — Ambulatory Visit (INDEPENDENT_AMBULATORY_CARE_PROVIDER_SITE_OTHER): Payer: Self-pay | Admitting: Internal Medicine

## 2017-07-25 VITALS — BP 140/89 | HR 104 | Temp 98.4°F | Wt 182.1 lb

## 2017-07-25 DIAGNOSIS — F1120 Opioid dependence, uncomplicated: Secondary | ICD-10-CM

## 2017-07-25 MED ORDER — BUPRENORPHINE HCL-NALOXONE HCL 8-2 MG SL FILM: 1.0000 | ORAL_FILM | Freq: Three times a day (TID) | SUBLINGUAL | 0 refills | Status: DC

## 2017-07-25 NOTE — Progress Notes (Signed)
Internal Medicine Clinic Attending  I saw and evaluated the patient.  I personally confirmed the key portions of the history and exam documented by Dr. Mikey BussingHoffman and I reviewed pertinent patient test results.  The assessment, diagnosis, and plan were formulated together and I agree with the documentation in the resident's note.  Patient with severe opioid use disorder, currently not doing well, in relapse for about the last 5 weeks despite medication assisted therapy.  Patient reports that last week was much better for him, denies any illicit drug use for last 1 week.  Says that he left his job which is where he was being exposed to illicit drugs.  He feels like if he is out of that environment he will be more successful.  Given that he feels he is done well over the last 1 week, then to continue with Suboxone 24 mg daily in divided doses.  He continues to report that this dose is very helpful for him, curbs his cravings.  UDS obtained today, I reviewed the database.  I told the patient's that if he is not able to achieve remission in the next few weeks we may have to escalate to a higher level of care.  Patient voiced understanding, he says he appreciates us being open with him.  He is confident that he can achieve remission over the coming weeks.  I would like to keep seeing him every 1 week, however our clinic is closed next week because of the Thanksgiving holiday, so we will see him in 2 weeks.  Plan to see him at least every 1 week after that until we are confident he is in sustained remission.

## 2017-07-25 NOTE — Progress Notes (Signed)
   07/25/2017  Omar Castillo presents for follow up of opioid use disorder I have reviewed the prior induction visit, follow up visits, and telephone encounters relevant to opiate use disorder (OUD) treatment.   Current daily dose: 24mg /day  Date of Induction: 05/17/17  Current follow up interval, in weeks: 1 week  The patient has not been adherent with the buprenorphine for OUD contract.   Last UDS Result: 11/6:  Buprenorphine, alprazolam, oxycodone/oxymorphone, fentanyl/norfentanyl  HPI: [name] is a 51 year old gentleman with OUD.  Patient reports having a good week. He states that his mother was hospitalized and he was able to keep her company throughout the week. He denies using oxycodone, fentanyl or heroin this week. He does report continued use of Xanax. He states that he quit his job due to being given questionable pills and was upset that fentanyl had shown up in his last drug screen. He states he is applying for another job and is waiting to hear back about starting soon.  He reports using buprenorphine 3 films a day and this helps to curb his cravings.  He denies withdrawal symptoms or side effects from buprenorphine.    Exam:   Vitals:   07/25/17 0837  BP: 140/89  Pulse: (!) 104  Temp: 98.4 F (36.9 C)  TempSrc: Oral  SpO2: 100%  Weight: 182 lb 1.6 oz (82.6 kg)    Physical Exam  Constitutional: He is well-developed, well-nourished, and in no distress. No distress.  Cardiovascular: Normal rate, regular rhythm and normal heart sounds. Exam reveals no gallop and no friction rub.  No murmur heard. Pulmonary/Chest: Effort normal and breath sounds normal. No respiratory distress. He has no wheezes. He has no rales.  Skin: He is not diaphoretic.     Assessment/Plan:  See Problem Based Charting in the Encounters Tab   Omar Castillo, Omar Ressler Ratliff, DO  07/25/2017  8:40 AM

## 2017-07-25 NOTE — Assessment & Plan Note (Addendum)
Assessment: Opioid use disorder Patient reports continued benefit from 3 films a day to help with cravings.  Last week's UDS was inappropriate, however patient denies any  illicit drug use this week except for Xanax..  Will continue current dose of buprenorphine and have patient follow up in 2 weeks (since clinic is closed next week).  Will provide a 2 week supply and have patient return on Nov 27th.  UDS 11/6 was reviewed.  narcotic database appropriate.  Plan -continue buprenorphine-naloxone 8-2mg  SL film TID, 2 week supply -UDS today - return in 2 week, then continue 1 week follow up until opioid use disorder is stable

## 2017-07-25 NOTE — Patient Instructions (Signed)
[name],  It was a pleasure seeing you today.  Keep up the good work.  We will see you back on the 27th.

## 2017-07-31 LAB — TOXASSURE SELECT,+ANTIDEPR,UR

## 2017-08-08 ENCOUNTER — Other Ambulatory Visit: Payer: Self-pay

## 2017-08-08 NOTE — Telephone Encounter (Signed)
Pt states his mother is at [facility] on vent, they will remove vent Thursday morning. Pt upset, states could he come to clinic Friday am, will send to dr's mullen and vincent for advisement he has enough suboxone thru tomorrow, he has been using 2 1/2 to 3 films daily. States he needs a lot of support at the moment, he's afraid of his mother passing.

## 2017-08-08 NOTE — Telephone Encounter (Signed)
Buprenorphine HCl-Naloxone HCl (SUBOXONE) 8-2 MG FILM, refill request. Please call pt back.

## 2017-08-08 NOTE — Telephone Encounter (Signed)
I will be in the clinic all day Friday.  [facility]  I will place a refill for a 2 day supply of suboxone to be called in to preferred pharmacy.   Thanks.

## 2017-08-08 NOTE — Telephone Encounter (Signed)
Attempted to call pt to set up appt for fri, lm for rtc

## 2017-08-09 NOTE — Telephone Encounter (Signed)
Spoke w/ pt he will be here fri am He will need suboxone fri am so can we call it in late tomorrow pm?

## 2017-08-09 NOTE — Telephone Encounter (Signed)
Would like Helen to call back.  

## 2017-08-10 MED ORDER — BUPRENORPHINE HCL-NALOXONE HCL 8-2 MG SL FILM: 1.0000 | ORAL_FILM | Freq: Three times a day (TID) | SUBLINGUAL | 0 refills | Status: DC

## 2017-08-10 NOTE — Telephone Encounter (Signed)
Yes, will do a 1 week supply, but he must come in on Friday.   Thanks!

## 2017-08-11 ENCOUNTER — Encounter: Payer: Self-pay | Admitting: Internal Medicine

## 2017-08-11 ENCOUNTER — Ambulatory Visit (INDEPENDENT_AMBULATORY_CARE_PROVIDER_SITE_OTHER): Payer: Self-pay | Admitting: Internal Medicine

## 2017-08-11 ENCOUNTER — Other Ambulatory Visit: Payer: Self-pay

## 2017-08-11 VITALS — BP 143/83 | HR 96 | Temp 98.2°F | Ht 67.0 in | Wt 183.4 lb

## 2017-08-11 DIAGNOSIS — F411 Generalized anxiety disorder: Secondary | ICD-10-CM

## 2017-08-11 DIAGNOSIS — Z79899 Other long term (current) drug therapy: Secondary | ICD-10-CM

## 2017-08-11 DIAGNOSIS — F1120 Opioid dependence, uncomplicated: Secondary | ICD-10-CM

## 2017-08-11 MED ORDER — BUSPIRONE HCL 10 MG PO TABS: 15.0000 mg | ORAL_TABLET | Freq: Three times a day (TID) | ORAL | 1 refills | Status: DC

## 2017-08-11 MED ORDER — BUPRENORPHINE HCL-NALOXONE HCL 8-2 MG SL FILM: 1.0000 | ORAL_FILM | Freq: Three times a day (TID) | SUBLINGUAL | 0 refills | Status: DC

## 2017-08-11 NOTE — Assessment & Plan Note (Signed)
Will refill his Buspar today and continue his Celexa. Discussed discontinuing the illicit xanax use.

## 2017-08-11 NOTE — Assessment & Plan Note (Signed)
Prior UDS (most recent 11/13) have been positive for opioids and xanax. He does admit to recent xanax and heroin use. Does appear to be seeking the appropriate care and remain hopeful that he will discontinue his illicit drug use though may be difficult given his current life stressors.   Will check UDS today. Provided with Rx for Buprenorphine-naloxone 8-2 mg films #36 to get him to 12/11 visit. Will need continued close follow up until his ODU is better controlled.

## 2017-08-11 NOTE — Patient Instructions (Signed)
[name],  I am sorry to hear about your mother. She has our best wishes. I would recommend keep going to the support groups and get as much support as you can to get through these tough times.  Please follow up with us on 12/11. If you need anything in the meantime please don't hesitate to call us.  FOLLOW-UP INSTRUCTIONS When: 08/22/17 For: Medications

## 2017-08-11 NOTE — Progress Notes (Signed)
   CC: ODU follow up  HPI:  Mr.Brenan L [name] is a 51 y.o. male with a past medical history listed below here today for follow up of his ODU.   Mr. [name] reports he is still having significant life stressors that are contributing to continued relapse for the past 6-7 weeks despite suboxone treatment. States that he feels the best he has felt since he was in his 2420s before his ODU began and the suboxone does control his cravings. However, he recently left his job due to it exposing him to illicit drugs. Most significantly his mother has [facility] and her health is deteriorating. This has caused him a lot of stress and he reports that this is the main reason for his relapse. He was in tears today after discussing his mother's health. Admits to snorting heroin every 1-2 weeks with most recent use 2 days ago. He also admits to obtained Xanax from a friend and notes being out of his Buspar. He reports he had been going to a support group up until his mother was in the hospital and that did help. He currently has no plan in place without anyone to call or talk to if he feels like using heroin again. He says he plans on going to another meeting this afternoon and discussing with one of the members there to become his sponsor. He reports taking two suboxone 8-2 mg films in the morning and half to 1 film in the evening depending on his cravings. He reports this eliminates his cravings.   Past Medical History:  Diagnosis Date  . Back pain, chronic   . Generalized anxiety disorder 07/25/2006  . Opioid use disorder, severe, on maintenance therapy (HCC) 05/23/2017  . 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 Juliene PinaGeoffrey   . Tobacco use 07/25/2006   Review of Systems:   Negative except as noted in HPI  Physical Exam:  Vitals:   08/11/17 1005  BP: (!) 143/83  Pulse: 96  Temp: 98.2 F (36.8 C)  TempSrc:  Oral  SpO2: 98%  Weight: 183 lb 6.4 oz (83.2 kg)  Height: 5\' 7"  (1.702 m)   GENERAL- alert, co-operative, appears as stated age, not in any distress. CARDIAC- RRR, no murmurs, rubs or gallops. RESP- Moving equal volumes of air, and clear to auscultation bilaterally, no wheezes or crackles. SKIN- Warm, dry, No rash or lesion. PSYCH- Normal mood and affect, appropriate thought content and speech.   Assessment & Plan:   See Encounters Tab for problem based charting.  Patient seen with Dr. Criselda PeachesMullen

## 2017-08-12 NOTE — Progress Notes (Signed)
Internal Medicine Clinic Attending  I saw and evaluated the patient.  I personally confirmed the key portions of the history and exam documented by Dr. Karma GreaserBoswell and I reviewed pertinent patient test results.  The assessment, diagnosis, and plan were formulated together and I agree with the documentation in the resident's note.  [name] presents for follow up of severe OUD.  He has been in relapse since 10/9 and continues to use heroin (last 2 days ago).  He is very tearful on exam explaining that his mom has metastatic cancer and is likely to need hospice at home.  He expresses strong desire to be clean and sober, but issues with dealing with stress.  He has plans to seek out a sponsor who has been 10 years clean to help when he is distressed which I encouraged him to do.  We will get a UDS today.  He does continue to take the buprenorphine and this helps with his cravings.  Follow up on 12/11.

## 2017-08-16 LAB — TOXASSURE SELECT,+ANTIDEPR,UR

## 2017-08-18 ENCOUNTER — Telehealth: Payer: Self-pay | Admitting: *Deleted

## 2017-08-18 NOTE — Telephone Encounter (Signed)
Lm for pt, to change 12/11 appt to 12/13

## 2017-08-25 ENCOUNTER — Ambulatory Visit (INDEPENDENT_AMBULATORY_CARE_PROVIDER_SITE_OTHER): Payer: Self-pay | Admitting: Student in an Organized Health Care Education/Training Program

## 2017-08-25 ENCOUNTER — Encounter: Payer: Self-pay | Admitting: Student in an Organized Health Care Education/Training Program

## 2017-08-25 VITALS — BP 143/80 | HR 75 | Temp 98.3°F | Ht 67.0 in | Wt 181.0 lb

## 2017-08-25 DIAGNOSIS — E119 Type 2 diabetes mellitus without complications: Secondary | ICD-10-CM

## 2017-08-25 DIAGNOSIS — F1120 Opioid dependence, uncomplicated: Secondary | ICD-10-CM

## 2017-08-25 DIAGNOSIS — F411 Generalized anxiety disorder: Secondary | ICD-10-CM

## 2017-08-25 DIAGNOSIS — E1165 Type 2 diabetes mellitus with hyperglycemia: Secondary | ICD-10-CM | POA: Insufficient documentation

## 2017-08-25 DIAGNOSIS — R739 Hyperglycemia, unspecified: Secondary | ICD-10-CM

## 2017-08-25 LAB — POCT GLYCOSYLATED HEMOGLOBIN (HGB A1C): HEMOGLOBIN A1C: 9.1

## 2017-08-25 LAB — GLUCOSE, CAPILLARY: GLUCOSE-CAPILLARY: 244 mg/dL — AB (ref 65–99)

## 2017-08-25 MED ORDER — CITALOPRAM HYDROBROMIDE 20 MG PO TABS: 20.0000 mg | ORAL_TABLET | Freq: Every day | ORAL | 1 refills | Status: DC

## 2017-08-25 MED ORDER — BUSPIRONE HCL 10 MG PO TABS: 15.0000 mg | ORAL_TABLET | Freq: Three times a day (TID) | ORAL | 1 refills | Status: DC

## 2017-08-25 MED ORDER — METFORMIN HCL 500 MG PO TABS: 500.0000 mg | ORAL_TABLET | Freq: Every day | ORAL | 1 refills | Status: DC

## 2017-08-25 MED ORDER — BUPRENORPHINE HCL-NALOXONE HCL 8-2 MG SL FILM: 1.0000 | ORAL_FILM | Freq: Three times a day (TID) | SUBLINGUAL | 0 refills | Status: DC

## 2017-08-25 NOTE — Patient Instructions (Signed)
Metformin tablets What is this medicine? METFORMIN (met FOR min) is used to treat type 2 diabetes. It helps to control blood sugar. Treatment is combined with diet and exercise. This medicine can be used alone or with other medicines for diabetes. This medicine may be used for other purposes; ask your health care provider or pharmacist if you have questions. COMMON BRAND NAME(S): Glucophage What should I tell my health care provider before I take this medicine? They need to know if you have any of these conditions: -anemia -dehydration -heart disease -frequently drink alcohol-containing beverages -kidney disease -liver disease -polycystic ovary syndrome -serious infection or injury -vomiting -an unusual or allergic reaction to metformin, other medicines, foods, dyes, or preservatives -pregnant or trying to get pregnant -breast-feeding How should I use this medicine? Take this medicine by mouth with a glass of water. Follow the directions on the prescription label. Take this medicine with food. Take your medicine at regular intervals. Do not take your medicine more often than directed. Do not stop taking except on your doctor's advice. Talk to your pediatrician regarding the use of this medicine in children. While this drug may be prescribed for children as young as 54 years of age for selected conditions, precautions do apply. Overdosage: If you think you have taken too much of this medicine contact a poison control center or emergency room at once. NOTE: This medicine is only for you. Do not share this medicine with others. What if I miss a dose? If you miss a dose, take it as soon as you can. If it is almost time for your next dose, take only that dose. Do not take double or extra doses. What may interact with this medicine? Do not take this medicine with any of the following medications: -dofetilide -certain contrast medicines given before X-rays, CT scans, MRI, or other  procedures This medicine may also interact with the following medications: -acetazolamide -certain antiviral medicines for HIV or AIDS or for hepatitis, like adefovir, dolutegravir, emtricitabine, entecavir, lamivudine, paritaprevir, or tenofovir -cimetidine -cobicistat -crizotinib -dichlorphenamide -digoxin -diuretics -male hormones, like estrogens or progestins and birth control pills -glycopyrrolate -isoniazid -lamotrigine -medicines for blood pressure, heart disease, irregular heart beat -memantine -midodrine -methazolamide -morphine -niacin -phenothiazines like chlorpromazine, mesoridazine, prochlorperazine, thioridazine -phenytoin -procainamide -propantheline -quinidine -quinine -ranitidine -ranolazine -steroid medicines like prednisone or cortisone -stimulant medicines for attention disorders, weight loss, or to stay awake -thyroid medicines -topiramate -trimethoprim -trospium -vancomycin -vandetanib -zonisamide This list may not describe all possible interactions. Give your health care provider a list of all the medicines, herbs, non-prescription drugs, or dietary supplements you use. Also tell them if you smoke, drink alcohol, or use illegal drugs. Some items may interact with your medicine. What should I watch for while using this medicine? Visit your doctor or health care professional for regular checks on your progress. A test called the HbA1C (A1C) will be monitored. This is a simple blood test. It measures your blood sugar control over the last 2 to 3 months. You will receive this test every 3 to 6 months. Learn how to check your blood sugar. Learn the symptoms of low and high blood sugar and how to manage them. Always carry a quick-source of sugar with you in case you have symptoms of low blood sugar. Examples include hard sugar candy or glucose tablets. Make sure others know that you can choke if you eat or drink when you develop serious symptoms of low  blood sugar, such as seizures or unconsciousness. They  must get medical help at once. Tell your doctor or health care professional if you have high blood sugar. You might need to change the dose of your medicine. If you are sick or exercising more than usual, you might need to change the dose of your medicine. Do not skip meals. Ask your doctor or health care professional if you should avoid alcohol. Many nonprescription cough and cold products contain sugar or alcohol. These can affect blood sugar. This medicine may cause ovulation in premenopausal women who do not have regular monthly periods. This may increase your chances of becoming pregnant. You should not take this medicine if you become pregnant or think you may be pregnant. Talk with your doctor or health care professional about your birth control options while taking this medicine. Contact your doctor or health care professional right away if think you are pregnant. If you are going to need surgery, a MRI, CT scan, or other procedure, tell your doctor that you are taking this medicine. You may need to stop taking this medicine before the procedure. Wear a medical ID bracelet or chain, and carry a card that describes your disease and details of your medicine and dosage times. What side effects may I notice from receiving this medicine? Side effects that you should report to your doctor or health care professional as soon as possible: -allergic reactions like skin rash, itching or hives, swelling of the face, lips, or tongue -breathing problems -feeling faint or lightheaded, falls -muscle aches or pains -signs and symptoms of low blood sugar such as feeling anxious, confusion, dizziness, increased hunger, unusually weak or tired, sweating, shakiness, cold, irritable, headache, blurred vision, fast heartbeat, loss of consciousness -slow or irregular heartbeat -unusual stomach pain or discomfort -unusually tired or weak Side effects that  usually do not require medical attention (report to your doctor or health care professional if they continue or are bothersome): -diarrhea -headache -heartburn -metallic taste in mouth -nausea -stomach gas, upset This list may not describe all possible side effects. Call your doctor for medical advice about side effects. You may report side effects to FDA at 1-800-FDA-1088. Where should I keep my medicine? Keep out of the reach of children. Store at room temperature between 15 and 30 degrees C (59 and 86 degrees F). Protect from moisture and light. Throw away any unused medicine after the expiration date. NOTE: This sheet is a summary. It may not cover all possible information. If you have questions about this medicine, talk to your doctor, pharmacist, or health care provider.  2018 Elsevier/Gold Standard (2016-03-09 15:34:19)

## 2017-08-25 NOTE — Assessment & Plan Note (Addendum)
Random glucose today 244 with other high FSG readings at home. This is a new diagnosis of diabetes. No history of ketosis. Normal renal function. Uninsured status limits medication agents. Plan is to start Metformin 500mg  daily for now, follow up in one month and we can increase the dose if he is tolerating it. Also likely would benefit from an ACEi in the future given mildly elevated blood pressure today.

## 2017-08-25 NOTE — Assessment & Plan Note (Signed)
Severe OUD currently on MAT with infrequent relapses. Snorted heroin once in the last two weeks. Lots of stress right now at home with mother's illness. He is going to meetings with [facility]. Still has friends who bring heroin around him. We talked about coping mechanisms. I think this also came from his Suboxone prescription running thin with our clinic closure earlier this week. Plan is to go back to 24mg  daily dosing for cravings. Ideally we would see him every 2 weeks, but our clinic will be closed for the winter holidays. So we will see him on 09/19/2017 for follow up. Tox screen obtained today, and I checked the database which was appropriate.

## 2017-08-25 NOTE — Progress Notes (Signed)
   08/25/2017  Omar Castillo presents for follow up of opioid use disorder I have reviewed the prior induction visit, follow up visits, and telephone encounters relevant to opiate use disorder (OUD) treatment.   Current daily dose: Suboxone 24mg  daily  Date of Induction: 05/17/17  Current follow up interval, in weeks: 2  The patient has not been adherent with the buprenorphine for OUD contract.   Last UDS Result: Positive for Bupe, but also heroin and benzo  HPI: 51 year old man with severe OUD that we started on MAT following a hospitalization for a life threatening overdose in September. He is struggling to make it through the stabilization phase, still having occasional relapses at home. Says he used once in the last two weeks, last night he snorted heroin. His usual OUD visit was cancelled on Tuesday because of inclement weather. He started rationing his Suboxone as a result, only to 1.5 films instead of 3. Then a friend came by his house and brought the heroin which he used without thinking. He says he didn't even feel high, and now he feels guilty about it. He lives with his mother, who has [facility] with [facility] of [facility], now discharging back to home today with home hospice. We talked about the importance of being sober if he is going to be her caregiver. He recognizes the importance of that.   He did note polyuria recently. He used his mother FSG meter and got a reading of 500. He took 40units of his mother's Lantus twice, without a physicians advice. Never had a diagnosis of diabetes before.  Exam:   Vitals:   08/25/17 1330  BP: (!) 143/80  Pulse: 75  Temp: 98.3 F (36.8 C)  TempSrc: Oral  SpO2: 98%  Weight: 181 lb (82.1 kg)  Height: 5\' 7"  (1.702 m)    Gen: well appearing, no distress Lungs: Clear throughout, no wheezing Heart:  RRR, no murmurs Skin: No rash or SSI Ext: Normal joints, no LE edema  Assessment/Plan:  See Problem Based Charting in the  Encounters Tab     Tyson AliasVincent, Omar Monday Thomas, MD  08/25/2017  2:05 PM

## 2017-08-25 NOTE — Assessment & Plan Note (Signed)
Symptomatically worse because of life stressors. No SI, relatively stable. Plan is to continue with Buspar and Citalopram, which I have refilled.

## 2017-08-31 LAB — TOXASSURE SELECT,+ANTIDEPR,UR

## 2017-09-18 ENCOUNTER — Telehealth: Payer: Self-pay

## 2017-09-18 ENCOUNTER — Encounter: Payer: Self-pay | Admitting: Internal Medicine

## 2017-09-18 ENCOUNTER — Ambulatory Visit (INDEPENDENT_AMBULATORY_CARE_PROVIDER_SITE_OTHER): Payer: Self-pay | Admitting: Internal Medicine

## 2017-09-18 ENCOUNTER — Other Ambulatory Visit: Payer: Self-pay

## 2017-09-18 VITALS — BP 153/111 | HR 85 | Temp 98.5°F | Wt 180.0 lb

## 2017-09-18 DIAGNOSIS — F1120 Opioid dependence, uncomplicated: Secondary | ICD-10-CM

## 2017-09-18 DIAGNOSIS — F17210 Nicotine dependence, cigarettes, uncomplicated: Secondary | ICD-10-CM

## 2017-09-18 DIAGNOSIS — Z7984 Long term (current) use of oral hypoglycemic drugs: Secondary | ICD-10-CM

## 2017-09-18 DIAGNOSIS — I10 Essential (primary) hypertension: Secondary | ICD-10-CM

## 2017-09-18 DIAGNOSIS — E119 Type 2 diabetes mellitus without complications: Secondary | ICD-10-CM

## 2017-09-18 MED ORDER — LISINOPRIL 10 MG PO TABS: 10.0000 mg | ORAL_TABLET | Freq: Every day | ORAL | 11 refills | Status: DC

## 2017-09-18 MED ORDER — METFORMIN HCL 500 MG PO TABS: 500.0000 mg | ORAL_TABLET | Freq: Two times a day (BID) | ORAL | 1 refills | Status: DC

## 2017-09-18 MED ORDER — BUPRENORPHINE HCL-NALOXONE HCL 8-2 MG SL FILM: 1.0000 | ORAL_FILM | Freq: Three times a day (TID) | SUBLINGUAL | 0 refills | Status: DC

## 2017-09-18 NOTE — Assessment & Plan Note (Addendum)
He was recently diagnosed with diabetes after reporting elevated blood glucose values and diabetic symptoms.  His A1c was 9.1.  He has been tolerating metformin 500 mg daily well without GI side effects.  Will increase to twice daily given his tolerance.  He requested his primary care needs be served by our clinic and we will continue to follow for his management of diabetes and other issues. Will assess for proteinuria today, will need eye/foot exam at future f/u.   --Increase Metformin to 500 mg BID  --Repeat A1c around 3/15 --Add microalb: Cr to urine sample- starting lisinopril (see HTN)

## 2017-09-18 NOTE — Telephone Encounter (Signed)
Would like Omar Castillo to call back.  

## 2017-09-18 NOTE — Patient Instructions (Addendum)
FOLLOW-UP INSTRUCTIONS When: 1 week  For: Opiod clinic What to bring: Medications     It was nice to meet you today [name].   We refilled your Suboxone today. Continue using the Suboxone, your sponsor, and meetings to try to avoid relapses. We know it's a difficult time with your mother's illness and health and would encourage you to utilize as many sources of support as possible including meetings and your sponsor, counseling services at [facility], and [facility] on the flyer I gave you today.   For your diabetes, you can start taking the Metformin twice a day with food instead of once a day. We will also start a blood pressure medication called Lisinopril that is important for both your blood pressure and diabetes. I [facility] the [facility] because it is on the $4 list.   We will see you back in 1 week.

## 2017-09-18 NOTE — Assessment & Plan Note (Addendum)
He is currently being treated with 24 mg Suboxone daily for severe OUD and has been having recent stressors with his mother's declining health that has led to several relapses with two occasions of snorting heroin over the last two weeks. He was again counseled on the benefits of sobriety and encouraged to utilize various resources that may help him cope with his increased stress and potential triggers for use including resources offered at [facility], [facility], and through his NA sponsor and meetings. He is also currently using Xanax which he reports he has used for 25 years and is more difficult to stop than opioids. Today, [facility] collected and paper prescription for Suboxone provided.   --Cont Suboxone 8-2 mg, 1 film, TID  --[facility] UDS --F/u in 1 week for ongoing management

## 2017-09-18 NOTE — Progress Notes (Signed)
   CC: Opiod use disorder management   HPI:  [name] is a 52 y.o. M who presents for follow up of opioid use disorder. I have reviewed the prior induction visit, follow up visits, and telephone encounters relevant to opiate use disorder (OUD) treatment.   Current daily dose: Suboxone 24mg  daily  Date of Induction: 05/17/17  Current follow up interval, in weeks: 1  The patient has been taking buprenorphine, but has had relapses   Last UDS Result: Positive for Bupe, but also heroin and benzo  Omar Castillo reports two relapses (snorting heroin) over the past two weeks which he attributes to stressors surrounding his mother's declining health (end stage lung cancer). A friend brought heroin to his house the first time, and on the second occasion he called his friend in order to use as he was feeling acutely stressed and down. He reports adherence with his Suboxone and that his relapses were not associated with a high as a result, and his use seems to be more driven by his emotions at this point. He continues to have feelings of guilt surrounding the relapses and states "it's nobody's fault by mine". He has been attending meetings [facility] and has recently established a sponsor. He has called him several times when experiencing cravings, but did not think to call on the day of him most recent relapse. Otherwise, he does not feel he has other family or friends that can offer support, and feels his brother has become less helpful in his mother's care as her health has continued to decline.   Diabetes: He was recently diagnosed with DM and was prescribed Metformin which has been taking as prescribed and tolerating well. He reports his sx of polyuria have decreased and feels his blood sugars have improved. He states he does not have a PCP and would like to use our clinic for other primary care needs as well as his opioid use disorder.   Past Medical History:  Diagnosis Date   . Back pain, chronic   . Generalized anxiety disorder 07/25/2006  . Opioid use disorder, severe, on maintenance therapy (HCC) 05/23/2017  . 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 Juliene PinaGeoffrey   . Tobacco use 07/25/2006   Review of Systems:  Review of Systems  Gastrointestinal: Negative for abdominal pain and diarrhea.  Genitourinary: Negative for frequency and urgency.  Endo/Heme/Allergies: Negative for polydipsia.  Psychiatric/Behavioral: Positive for substance abuse.     Physical Exam:  Vitals:   09/18/17 1031  BP: (!) 153/111  Pulse: 85  Temp: 98.5 F (36.9 C)  TempSrc: Oral  SpO2: 98%  Weight: 180 lb (81.6 kg)   General: Sitting in chair comfortably, no acute distress HEENT: PERRL  CV: RRR, no murmur appreciated  Resp: Clear breath sounds bilaterally, normal work of breathing, no distress  Extr: No edema, good peripheral pulses  Neuro: Alert and oriented x3  Skin: Warm, dry      Assessment & Plan:   See Encounters Tab for problem based charting.  Patient seen with Dr. Criselda PeachesMullen

## 2017-09-18 NOTE — Assessment & Plan Note (Signed)
Patient had a slightly elevated blood pressure at his last visit and today it is again elevated at 153/111.  Given his new diagnosis of diabetes he would benefit from treatment with an ACE inhibitor which was initiated today with lisinopril 10 mg daily.  Can titrate up dose as needed.  This medication [facility] the [facility] and should be affordable for the patient.   --Start Lisinopril 10 mg daily --BMP at f/u in 1 week

## 2017-09-19 LAB — MICROALBUMIN / CREATININE URINE RATIO
CREATININE, UR: 268.7 mg/dL
MICROALBUM., U, RANDOM: 11.9 ug/mL
Microalb/Creat Ratio: 4.4 mg/g creat (ref 0.0–30.0)

## 2017-09-19 NOTE — Progress Notes (Signed)
Internal Medicine Clinic Attending  I saw and evaluated the patient.  I personally confirmed the key portions of the history and exam documented by Dr. Anthonette LegatoHarden and I reviewed pertinent patient test results.  The assessment, diagnosis, and plan were formulated together and I agree with the documentation in the resident's note.  [name] is a patient with OUD is currently in treatment with buprenorphine.  He is a former heroin user.  Currently with relapse and intermittent use due to increased stress related to his mother's illness.  From our previous conversations, she is in the end stage of her disease and will likely not live much longer.  This is a constant stress for him.  He has sought out a sponsor which I congratulated him on.  We also discussed grief counseling and family counseling during this high stress time.  We discussed continued abstinence and he is doing better than when he initially presented to us.  He has a strong desire to be clean.  Continue 1 week follow up (longer over the holiday) until UDS is persistently appropriate.    I would like him to start seeing our clinic for primary care as well.  Will ask the front desk to assign him a PCP.

## 2017-09-20 NOTE — Telephone Encounter (Signed)
Would like Helen to call back.  

## 2017-09-20 NOTE — Telephone Encounter (Signed)
Rtc, no answer 

## 2017-09-23 LAB — TOXASSURE SELECT,+ANTIDEPR,UR

## 2017-09-26 ENCOUNTER — Ambulatory Visit: Payer: Self-pay

## 2017-09-27 ENCOUNTER — Ambulatory Visit (INDEPENDENT_AMBULATORY_CARE_PROVIDER_SITE_OTHER): Payer: Self-pay | Admitting: Internal Medicine

## 2017-09-27 VITALS — BP 129/77 | HR 90 | Temp 97.9°F | Wt 184.6 lb

## 2017-09-27 DIAGNOSIS — F17210 Nicotine dependence, cigarettes, uncomplicated: Secondary | ICD-10-CM

## 2017-09-27 DIAGNOSIS — F411 Generalized anxiety disorder: Secondary | ICD-10-CM

## 2017-09-27 DIAGNOSIS — F1120 Opioid dependence, uncomplicated: Secondary | ICD-10-CM

## 2017-09-27 DIAGNOSIS — E119 Type 2 diabetes mellitus without complications: Secondary | ICD-10-CM

## 2017-09-27 DIAGNOSIS — I10 Essential (primary) hypertension: Secondary | ICD-10-CM

## 2017-09-27 DIAGNOSIS — T383X5D Adverse effect of insulin and oral hypoglycemic [antidiabetic] drugs, subsequent encounter: Secondary | ICD-10-CM

## 2017-09-27 DIAGNOSIS — K521 Toxic gastroenteritis and colitis: Secondary | ICD-10-CM

## 2017-09-27 DIAGNOSIS — Z79899 Other long term (current) drug therapy: Secondary | ICD-10-CM

## 2017-09-27 DIAGNOSIS — Z7984 Long term (current) use of oral hypoglycemic drugs: Secondary | ICD-10-CM

## 2017-09-27 MED ORDER — BUPRENORPHINE HCL-NALOXONE HCL 8-2 MG SL FILM: 1.0000 | ORAL_FILM | Freq: Three times a day (TID) | SUBLINGUAL | 0 refills | Status: DC

## 2017-09-27 MED ORDER — FLUOXETINE HCL 20 MG PO TABS: 20.0000 mg | ORAL_TABLET | Freq: Every day | ORAL | 3 refills | Status: DC

## 2017-09-27 NOTE — Assessment & Plan Note (Signed)
Tolerating increased metformin dose well and denies polyuria, polydipsia.  Microalbumin to creatinine ratio within normal limits.  We will continue to bring his diabetic care up-to-date at subsequent visits and with newly established PCP.  Can also consider continuing to uptitrate metformin dose as tolerated.

## 2017-09-27 NOTE — Patient Instructions (Signed)
Good to see you again today [name].  Congratulations on having a great week and keep up the good work.  We know you are going through a hard time and making progress with speaking with counselors and avoiding relapsing is impressive.  We sent a refill of your Suboxone to the pharmacy and will see you again in a week.  To help with your anxiety we prescribed Prozac that you did well with in the past.  Hopefully this will help your anxiety and slowly over time you can also decrease the use of Xanax.

## 2017-09-27 NOTE — Assessment & Plan Note (Signed)
Treatment initiated last visit with lisinopril 10 mg daily after several elevated blood pressure values.  BP today has improved at 129/77.  We will continue at same dose.  Intended to collect BMP at this visit but will ensure it is performed at 1 week follow-up.  --Cont Lisinopril 10 mg daily --BMP at f/u

## 2017-09-27 NOTE — Assessment & Plan Note (Signed)
He notes a history of generalized anxiety for which he self medicates with 0.5 mg Xanax.  He has been on BuSpar and Celexa in an attempt to better control his symptoms and wean off benzodiazepines.  However, he has not been taking Celexa due to side effects.  Given his past positive experience with fluoxetine, will start it today which will hopefully lead to better control.  He has also done well to start to speak with counselors and utilize resources for emotional support given his life stressors.  --DC celexa, Start fluoxetine 20 mg daily --Cont buspar

## 2017-09-27 NOTE — Progress Notes (Signed)
   CC: Opiate use disorder follow-up  HPI:  [name] is a 52 y.o. male with a past medical history as described below who presents to the clinic for a OUD follow-up.  OUD: Patient states he has had a good week with no relapses.  His mother was able to leave the hospital (she has late stage lung cancer and is facing many end-of-life care issues which have been a stressor and driver of relapses for the patient).  He has spoken with counselors both here and at [facility] for strategies to cope with his mother's illness which he feels has been helpful.  He also appreciates the care he receives here in clinic from the staff which is also been a source of support.  DM: His metformin was recently increased to 500 twice daily, notes increased upset stomach with the second dose for several days but has begun to subside.  Continues to report his prior polyuria symptoms have resolved.  HTN: He is also recently started on lisinopril 10 mg daily for elevated blood pressure.  Tolerating well and blood pressure improved today.  Anxiety: While he has not had relapses of opiates, he does note he continues to use Xanax regularly which has been harder for him to decrease his use of.  He states he takes about 0.5 mg once a day around lunchtime with increased feelings of anxiety.  He is not currently taking Celexa which was previously prescribed for anxiety as he experienced GI side effects.  He is taking BuSpar.  He notes in the past he was prescribed Prozac which was effective and he does not remember experiencing negative side effects.   Past Medical History:  Diagnosis Date  . Back pain, chronic   . Generalized anxiety disorder 07/25/2006  . Opioid use disorder, severe, on maintenance therapy (HCC) 05/23/2017  . 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 Juliene PinaGeoffrey   . Tobacco use 07/25/2006   Review of Systems:  Review of Systems  Gastrointestinal:  Positive for diarrhea (metformin related-improving ).  Genitourinary: Negative for frequency.  Psychiatric/Behavioral: The patient is nervous/anxious.      Physical Exam:  Vitals:   09/27/17 0920  BP: 129/77  Pulse: 90  Temp: 97.9 F (36.6 C)  TempSrc: Oral  SpO2: 99%  Weight: 184 lb 9.6 oz (83.7 kg)   General: Sitting in chair comfortably, no acute distress HEENT: Normal conjuctiva, moist mucus membranes CV: Regular rate and rhythm Resp: Normal work of breathing, no distress  Neuro: Alert and oriented x3 Skin: Warm, dry      Assessment & Plan:   See Encounters Tab for problem based charting.  Patient seen with Dr. Cleda DaubE. Hoffman

## 2017-09-27 NOTE — Assessment & Plan Note (Signed)
Currently being treated with 24 mg Suboxone daily for severe OUD.  He denies recurrent relapses this week after struggling with his use in the setting of increased stress.  He has been encouraged by this successful week as well as speaking with counselors and opening up more with his emotions.  He was encouraged to continue to use these resources and implement healthy coping strategies.  We will continue with current dose of Suboxone in 1 week follow-up interval, will repeat UDS today.  --Refill Suboxone 8-2 mg 1 film TID today --[facility] UDS --1 wk f/u

## 2017-09-28 NOTE — Progress Notes (Signed)
Internal Medicine Clinic Attending  I saw and evaluated the patient.  I personally confirmed the key portions of the history and exam documented by Dr. Harden and I reviewed pertinent patient test results.  The assessment, diagnosis, and plan were formulated together and I agree with the documentation in the resident's note.  

## 2017-10-03 ENCOUNTER — Ambulatory Visit (INDEPENDENT_AMBULATORY_CARE_PROVIDER_SITE_OTHER): Payer: Self-pay | Admitting: Internal Medicine

## 2017-10-03 VITALS — BP 131/80 | HR 78 | Temp 97.7°F | Resp 20 | Wt 181.2 lb

## 2017-10-03 DIAGNOSIS — F411 Generalized anxiety disorder: Secondary | ICD-10-CM

## 2017-10-03 DIAGNOSIS — F1120 Opioid dependence, uncomplicated: Secondary | ICD-10-CM

## 2017-10-03 MED ORDER — BUPRENORPHINE HCL-NALOXONE HCL 8-2 MG SL FILM: 1.0000 | ORAL_FILM | Freq: Three times a day (TID) | SUBLINGUAL | 0 refills | Status: DC

## 2017-10-03 MED ORDER — FLUOXETINE HCL 20 MG PO TABS: 20.0000 mg | ORAL_TABLET | Freq: Every day | ORAL | 3 refills | Status: DC

## 2017-10-03 NOTE — Progress Notes (Signed)
   10/03/2017  Omar Castillo presents for follow up of opioid use disorder I have reviewed the prior induction visit, follow up visits, and telephone encounters relevant to opiate use disorder (OUD) treatment.   Current daily dose: 24 mg/day  Date of Induction: 05/17/2017  Current follow up interval, in weeks: 2  The patient has been adherent with the buprenorphine for OUD contract.   Last UDS Result: Positive for buprenorphine, alprazolam, codeine and morphine on September 18, 2017. Urine collected on September 27, 2017 was not resulted yet.  HPI: 52 year old gentleman with severe OGD and frequent relapses and history of life-threatening overdose in September came to the clinic for follow-up. He is using 24 mg of Suboxone daily with 2 films in the morning and 1 at 5:30 PM.  He denies any side effects.  According to him he is feeling much better, he is getting counseling from a [facility] resource regarding coping with his mother's terminal illness and himself and stating that it really helps.  He denies any recent heroin or morphine use during the past week, according to patient his last use was 2 weeks ago.  He continued to use Xanax at 0.5 mg twice daily, stating that if he does not use it he started becoming jittery.  He has an history of Xanax use for the past 20 years, used to get it from a pain clinic and still have some leftovers.  He is trying to wean himself off.  According to him he used to take 4 mg daily now he is taking 1 mg daily.  Exam:   Vitals:   10/03/17 1109  BP: 131/80  Pulse: 78  Resp: 20  Temp: 97.7 F (36.5 C)  TempSrc: Oral  SpO2: 97%  Weight: 181 lb 3.2 oz (82.2 kg)   Gen. well-developed, well-nourished gentleman, in no acute distress. Psych.  Normal mood and affect. CVS.  Regular rate and rhythm. Lungs.  Clear bilaterally.   Assessment/Plan:  See Problem Based Charting in the Encounters Tab     Omar Castillo, Omar Dede, MD  10/03/2017  11:28 AM

## 2017-10-03 NOTE — Patient Instructions (Signed)
You for visiting clinic today. [facility], please take it as directed. A new prescription of Suboxone was [facility]. Please follow-up in 2 weeks.

## 2017-10-03 NOTE — Assessment & Plan Note (Signed)
He has an history of severe OUD, with multiple relapses.  Last use of morphine and heroin was approximately 2 weeks ago. According to patient he did not use any of those during the past 2 weeks.  He denies any craving currently and adherent with his Suboxone.  He denies any side effect. He is getting counseling for coping skills with his addiction and with his mother's terminal illness, stating that it really helps. He was encouraged to continue counseling.  He was provided with 2 weeks of Suboxone. Follow-up in 2 weeks.

## 2017-10-03 NOTE — Assessment & Plan Note (Signed)
He continues to use Xanax, although trying to wean himself off. he was using it for 20 years, used to take 4 mg daily now he is taking 1 mg daily. He was given a prescription of Prozac during the last office visit to help with his anxiety disorder, he was unable to get that [facility] from [facility] because of the cost, requesting a [facility] which was provided.  He was encouraged to continue weaning himself off from Xanax with a goal of completely stopping it.

## 2017-10-04 LAB — TOXASSURE SELECT,+ANTIDEPR,UR

## 2017-10-04 NOTE — Progress Notes (Signed)
Internal Medicine Clinic Attending  I saw and evaluated the patient.  I personally confirmed the key portions of the history and exam documented by Dr. Amin and I reviewed pertinent patient test results.  The assessment, diagnosis, and plan were formulated together and I agree with the documentation in the resident's note. 

## 2017-10-05 ENCOUNTER — Telehealth: Payer: Self-pay | Admitting: *Deleted

## 2017-10-05 MED ORDER — FLUOXETINE HCL 20 MG PO CAPS: 20.0000 mg | ORAL_CAPSULE | Freq: Every day | ORAL | 2 refills | Status: DC

## 2017-10-05 NOTE — Telephone Encounter (Signed)
Fluoxetine prescription changed from tabs to capsules due to reduced cost of capsules.  Ginette OttoAlec Melvin, DO IM PGY-1

## 2017-10-05 NOTE — Telephone Encounter (Signed)
[facility] - wants to know if u can change Fluoxetine 20 mg tablets to capsules which is cheaper? [employer]

## 2017-10-12 ENCOUNTER — Telehealth: Payer: Self-pay | Admitting: Internal Medicine

## 2017-10-12 LAB — TOXASSURE SELECT,+ANTIDEPR,UR

## 2017-10-12 NOTE — Telephone Encounter (Signed)
Patient requesting [facility] prozac [facility], if you can give a prescription or call for the capsule form it will fall under the 4.00 list the other one is 90.00

## 2017-10-14 NOTE — Telephone Encounter (Signed)
His fluoxetine was already changed to capsules on 1/24 after we received a message from the pharmacy. Thank you.

## 2017-10-17 ENCOUNTER — Ambulatory Visit (INDEPENDENT_AMBULATORY_CARE_PROVIDER_SITE_OTHER): Payer: Self-pay | Admitting: Internal Medicine

## 2017-10-17 ENCOUNTER — Encounter: Payer: Self-pay | Admitting: Internal Medicine

## 2017-10-17 DIAGNOSIS — Z79899 Other long term (current) drug therapy: Secondary | ICD-10-CM

## 2017-10-17 DIAGNOSIS — Z915 Personal history of self-harm: Secondary | ICD-10-CM

## 2017-10-17 DIAGNOSIS — F1120 Opioid dependence, uncomplicated: Secondary | ICD-10-CM

## 2017-10-17 DIAGNOSIS — F411 Generalized anxiety disorder: Secondary | ICD-10-CM

## 2017-10-17 MED ORDER — BUPRENORPHINE HCL-NALOXONE HCL 8-2 MG SL FILM: 1.0000 | ORAL_FILM | Freq: Three times a day (TID) | SUBLINGUAL | 0 refills | Status: DC

## 2017-10-17 NOTE — Assessment & Plan Note (Signed)
Well controlled today.  He had a relapse a couple of month ago, but remains clean today.  UDS shows no heroin or other substances, except the xanax which he admits to.   Plan Continue TID buprenorphine-naloxone 8-2mg  UDS today Checked [place] narcotic database and was appropriate.   Follow up in 2 weeks.

## 2017-10-17 NOTE — Progress Notes (Signed)
   10/17/2017  Omar Castillo presents for follow up of opioid use disorder I have reviewed the prior induction visit, follow up visits, and telephone encounters relevant to opiate use disorder (OUD) treatment.   Current daily dose: 24mg /day  Date of Induction: 05/17/17  Current follow up interval, in weeks: 2 weeks  The patient has not been adherent with the buprenorphine for OUD contract.   Last UDS Result: + for xanax and buprenorphine.    HPI: Omar Castillo is a 52yo man with PMH of severe OUD with history of OD who presents for MAT.  He has coexisting anxiety and continues to use illicit xanax.  He is also on buspar and Prozac from our clinic and reports that these help him.  He has a very stressful home situation right now as his mother is on hospice care at home. I think we will not be able to get him to wean completely off of xanax until this situation resolves itself.  He is seeing a grief counselor through hospice who seems to be helping him.  He denies any illicit use of heroin or other drugs.  He is not associating with former friends who also use heroin.  He is trying to stay clean.  His last UDS was reviewed.  [place] narcotic database was reviewed and was appropriate.   Exam:   There were no vitals filed for this visit.  Gen: Awake, alert, conversant HENT: Anicteric sclerae, no conjunctival injection Pulm: Breathing comfortably, no audible wheezing Psych: Pleasant mood, no acute distress  Assessment/Plan:  See Problem Based Charting in the Encounters Tab     Omar Castillo, Emily B, MD  10/17/2017  1:51 PM

## 2017-10-17 NOTE — Assessment & Plan Note (Signed)
This is a difficult issue for [name].  We have started him on medications here in the clinic which have helped him.  He reports that his symptoms are mostly well controlled, but he continues to take Xanax which is not prescribed to him to keep from feeling "jittery."  He has a mother who is on hospice and this is his main support person and closest family member.  I have a feeling we will not be successful in getting him to completely stop using anxiolytics during this acute time.  Given the length of time he has been on medication, it may also be hard to completely wean to nothing.    Plan Continue buspar and prozac Emphasized need to stop using illicit xanax.  He understands.

## 2017-10-17 NOTE — Patient Instructions (Signed)
Mr. [name] - -  Thank you for coming in to see us today.  You are doing very well.  Our next goal will be to wean you off of xanax, but I understand this is a stressful time for you.    Please come back to see me in 2 weeks.  Thanks!

## 2017-10-21 LAB — TOXASSURE SELECT,+ANTIDEPR,UR

## 2017-10-27 ENCOUNTER — Other Ambulatory Visit: Payer: Self-pay | Admitting: Internal Medicine

## 2017-10-27 DIAGNOSIS — E119 Type 2 diabetes mellitus without complications: Secondary | ICD-10-CM

## 2017-10-27 MED ORDER — METFORMIN HCL 500 MG PO TABS: 500.0000 mg | ORAL_TABLET | Freq: Two times a day (BID) | ORAL | 3 refills | Status: DC

## 2017-10-27 NOTE — Telephone Encounter (Signed)
Refill Approved

## 2017-10-27 NOTE — Telephone Encounter (Signed)
Patient is requesting refill on metformin 500mg 

## 2017-10-31 ENCOUNTER — Ambulatory Visit (INDEPENDENT_AMBULATORY_CARE_PROVIDER_SITE_OTHER): Payer: Self-pay | Admitting: Internal Medicine

## 2017-10-31 DIAGNOSIS — I10 Essential (primary) hypertension: Secondary | ICD-10-CM

## 2017-10-31 DIAGNOSIS — Z79899 Other long term (current) drug therapy: Secondary | ICD-10-CM

## 2017-10-31 DIAGNOSIS — F1120 Opioid dependence, uncomplicated: Secondary | ICD-10-CM

## 2017-10-31 DIAGNOSIS — R5383 Other fatigue: Secondary | ICD-10-CM

## 2017-10-31 MED ORDER — BUPRENORPHINE HCL-NALOXONE HCL 8-2 MG SL FILM: 1.0000 | ORAL_FILM | Freq: Three times a day (TID) | SUBLINGUAL | 0 refills | Status: DC

## 2017-10-31 NOTE — Assessment & Plan Note (Signed)
Initially elevated, but improved on recheck to 138 systolic.  He is on lisinopril.   Plan Continue lisinopril.

## 2017-10-31 NOTE — Patient Instructions (Signed)
Rylen - --   Please keep taking your suboxone as prescribed.   Goals for this next 2 weeks will be to avoid all illicit narcotics (heroin or pills).  Please let us know how your mom is doing.    Thanks!  Come back in 2 weeks.

## 2017-10-31 NOTE — Progress Notes (Signed)
   10/31/2017  Omar StainsJohnny L Ballow presents for follow up of opioid use disorder I have reviewed the prior induction visit, follow up visits, and telephone encounters relevant to opiate use disorder (OUD) treatment.   Current daily dose: 24mg /day  Date of Induction: 05/17/17  Current follow up interval, in weeks: 2 weeks.  The patient has not been adherent with the buprenorphine for OUD contract.   Last UDS Result: Xanax, buprenorphine, metabolite of oxycodone (but no oxycodone) - patient denies taking pills.   HPI: Mr. [name] is a 52yo gentleman with severe OUD who is in treatment with suboxone therapy.  He has been taking his suboxone and reports that cravings are pretty low.  However, he has had a severe increase in stress as his mother is now in the hospital again and [place] is [place] to home quite often.  He had a lapse with use of heroin on Sunday due to poor judgement and hanging out with a friend who he shouldn't have.  He has made strides to distant himself from friends who use, including changing his phone number which I congratulated him on, however, this friend came to his home.  He appeared very fatigued today and he reported that he was going to his Aunt's to sleep and he is sure no one will bother him there.  He continues to take illicit xanax.  I was considering offering to treat him through our clinic, but I am not certain he is completely out of relapse at this time.  Will continue to monitor the situation.  His BP was initially elevated, and this improved.    ROS: + fatigue, + stress.  No chest pain or SOB  Exam:   Vitals:   10/31/17 1122  BP: (!) 150/83  Pulse: 77  Temp: 98.1 F (36.7 C)  TempSrc: Oral  SpO2: 97%  Weight: 189 lb 6.4 oz (85.9 kg)    Gen: Appears very fatigued, but able to follow conversation, oriented.  Eyes: He has an area of discoloration under left eye, no icterus Pulm: breathing comfortably on room air, no audible wheezing Psych: Tearful  when speaking about relapse.   Assessment/Plan:  See Problem Based Charting in the Encounters Tab     Inez CatalinaMullen, Emily B, MD  10/31/2017  11:24 AM

## 2017-10-31 NOTE — Assessment & Plan Note (Signed)
Omar BerkshireJohnny still struggles with intermittent use, related to anxiety, stress and lack of sleep.  He remains committed to being clean and is taking his buprenorphine as evidenced by his UDS.  [place] narcotic database is appropriate.  UDS discussed above.    Plan Continue current suboxone dose UDS today.  Continued counseling, he is seeing a counselor regarding his mom's diagnosis (on hospice) Consider psychiatry consult

## 2017-11-06 LAB — TOXASSURE SELECT,+ANTIDEPR,UR

## 2017-11-14 ENCOUNTER — Ambulatory Visit (INDEPENDENT_AMBULATORY_CARE_PROVIDER_SITE_OTHER): Payer: Self-pay | Admitting: Internal Medicine

## 2017-11-14 VITALS — BP 122/71 | HR 75 | Temp 98.1°F | Resp 20 | Wt 179.3 lb

## 2017-11-14 DIAGNOSIS — F411 Generalized anxiety disorder: Secondary | ICD-10-CM

## 2017-11-14 DIAGNOSIS — I10 Essential (primary) hypertension: Secondary | ICD-10-CM

## 2017-11-14 DIAGNOSIS — F1120 Opioid dependence, uncomplicated: Secondary | ICD-10-CM

## 2017-11-14 DIAGNOSIS — F11288 Opioid dependence with other opioid-induced disorder: Secondary | ICD-10-CM

## 2017-11-14 DIAGNOSIS — Z79899 Other long term (current) drug therapy: Secondary | ICD-10-CM

## 2017-11-14 MED ORDER — BUPRENORPHINE HCL-NALOXONE HCL 8-2 MG SL FILM: 1.0000 | ORAL_FILM | Freq: Three times a day (TID) | SUBLINGUAL | 0 refills | Status: DC

## 2017-11-14 NOTE — Assessment & Plan Note (Signed)
Doing pretty well on buspar 15mg  TID, prozac 20mg  daily and xanax daily for flares (he obtains this illicitly).  I have been considering prescribing Xanax for him, but I think that he would be better served with a psychiatrist managing his anxiety given he requires 3 medications.  He is only on the initial dose of prozac, and this could be increased to 30mg .   Plan Attempt to get insurance (orange card) Future referral to psychiatry Consider increase of prozac to 30mg  at next visit.

## 2017-11-14 NOTE — Progress Notes (Signed)
   11/14/2017  Omar StainsJohnny L Castillo presents for follow up of opioid use disorder I have reviewed the prior induction visit, follow up visits, and telephone encounters relevant to opiate use disorder (OUD) treatment.   Current daily dose: 24mg /day, TID  Date of Induction: 05/17/17  Current follow up interval, in weeks: 2 weeks  The patient has not been adherent with the buprenorphine for OUD contract.   Last UDS Result: + for buprenorphine, xanax, heroin (self-disclosed)  HPI: Omar BerkshireJohnny is a 52yo man with OUD who presents for OBOT.  He has been having increased stressed due to his mother being hospitalized the last time I saw him.  Today he reports that he did well over the last 2 weeks.  He had no illicit use of heroin and no relapse.  He feels that his stress is at the same level as previous.  He is taking illicit xanax daily, and knows that he should wean down from this medication.  He does not feel he is in danger getting this substance.  He would be interested in seeing psychiatry in the future for his anxiety, but he is caretaking for his mother right now which makes leaving the house difficult; he further has no insurance currently.  He tends to relapse when his stress is elevated and I am concerned that when his mother passes (she is on hospice for cancer) he may have a relapse.  I continue to discuss with him coping mechanisms (he is seeing a grief counselor) and seeking out help from a sponsor or support group.   ROS: + cravings, mild and able to be managed.  No chest pain, SOB, fevers.   Exam:   Vitals:   11/14/17 1054  BP: 122/71  Pulse: 75  Resp: 20  Temp: 98.1 F (36.7 C)  TempSrc: Oral  SpO2: 98%  Weight: 179 lb 4.8 oz (81.3 kg)    General: Well dressed, NAD, no sweating Eyes: Anicteric sclerae, no conjunctival injection  Pulm: Breathing comfortably on room air Psych: Pleasant, conversant, normal mood.   Assessment/Plan:  See Problem Based Charting in the Encounters  Tab     Inez CatalinaMullen, Emily B, MD  11/14/2017  11:14 AM

## 2017-11-14 NOTE — Assessment & Plan Note (Signed)
BP is well controlled today at 122/71 on lisinopril.   Plan Continue lisinopril

## 2017-11-14 NOTE — Assessment & Plan Note (Signed)
No relapses in last 2 weeks, which I congratulated him on.  His use is mainly related to acute increase in stress due to health condition of his mother.  He also has a large anxiety component.    Plan Continue Suboxone 8mg -2mg  TID [place] narcotic database appropriate.  UDS done today - reports only illicit use is continued xanax use Follow up in 2 weeks.

## 2017-11-14 NOTE — Patient Instructions (Signed)
Damico - -  Thank you for coming in today!  I am glad you were able to stay clean this week from narcotics.  We will continue to see you every 2 weeks for now and discuss further at next visit.   Come back in 2 weeks to see Dr. Oswaldo DoneVincent.

## 2017-11-18 LAB — TOXASSURE SELECT,+ANTIDEPR,UR

## 2017-11-28 ENCOUNTER — Encounter (INDEPENDENT_AMBULATORY_CARE_PROVIDER_SITE_OTHER): Payer: Self-pay

## 2017-11-28 ENCOUNTER — Ambulatory Visit (INDEPENDENT_AMBULATORY_CARE_PROVIDER_SITE_OTHER): Payer: Self-pay | Admitting: Student in an Organized Health Care Education/Training Program

## 2017-11-28 VITALS — BP 139/83 | HR 74 | Temp 97.8°F | Ht 67.0 in | Wt 182.7 lb

## 2017-11-28 DIAGNOSIS — F1120 Opioid dependence, uncomplicated: Secondary | ICD-10-CM

## 2017-11-28 DIAGNOSIS — E119 Type 2 diabetes mellitus without complications: Secondary | ICD-10-CM

## 2017-11-28 DIAGNOSIS — Z79899 Other long term (current) drug therapy: Secondary | ICD-10-CM

## 2017-11-28 DIAGNOSIS — Z7984 Long term (current) use of oral hypoglycemic drugs: Secondary | ICD-10-CM

## 2017-11-28 DIAGNOSIS — F411 Generalized anxiety disorder: Secondary | ICD-10-CM

## 2017-11-28 DIAGNOSIS — I10 Essential (primary) hypertension: Secondary | ICD-10-CM

## 2017-11-28 LAB — POCT GLYCOSYLATED HEMOGLOBIN (HGB A1C): HEMOGLOBIN A1C: 7.9

## 2017-11-28 LAB — GLUCOSE, CAPILLARY: GLUCOSE-CAPILLARY: 229 mg/dL — AB (ref 65–99)

## 2017-11-28 MED ORDER — BUSPIRONE HCL 10 MG PO TABS: 15.0000 mg | ORAL_TABLET | Freq: Three times a day (TID) | ORAL | 1 refills | Status: DC

## 2017-11-28 MED ORDER — BUPRENORPHINE HCL-NALOXONE HCL 8-2 MG SL FILM: 1.0000 | ORAL_FILM | Freq: Three times a day (TID) | SUBLINGUAL | 0 refills | Status: DC

## 2017-11-28 NOTE — Assessment & Plan Note (Signed)
Blood pressure is well controlled today.  Plan to continue with lisinopril 10 mg daily.  [facility]

## 2017-11-28 NOTE — Assessment & Plan Note (Signed)
Stable symptoms, though still does get some polyuria when he misses a dose of metformin.  Plan is to continue with metformin 500 mg twice daily, [facility]  Check point-of-care A1c today.

## 2017-11-28 NOTE — Progress Notes (Signed)
   11/28/2017  Omar Castillo presents for follow up of opioid use disorder I have reviewed the prior induction visit, follow up visits, and telephone encounters relevant to opiate use disorder (OUD) treatment.   Current daily dose: Suboxone 24 mg daily  Date of Induction: 05/17/2017  Current follow up interval, in weeks: 2  The patient has not been adherent with the buprenorphine for OUD contract.   Last UDS Result: Inappropriate for polysubstances  HPI: 52 year old man here for follow-up of opioid use disorder.  Unfortunately the patient had relapse and began using heroin in mid February.  Though today he is telling me that he is doing very well, has not used heroin in over 2 weeks.  He says that he is compliant with Suboxone 3 times daily.  Reports that it is successful in managing withdrawal and improving cravings.  Is going through a lot of social stress right now.  His mother just entered inpatient hospice 1 week ago.  He says it has been a lot easier for her now that she is in [place] and he is not driving back and from forth from [place] each day.  He reports good compliance with his lisinopril and metformin as well, though occasionally he misses an evening dose of metformin and says he notices increased polyuria as a result.  No fevers or chills.  No recent overdoses.  Eating and drinking well.  Anxiety symptoms are stable, he still uses Xanax as needed for anxiety symptoms.  He says he just feels more anxious as the day goes on.  Exam:   Vitals:   11/28/17 1023  BP: 139/83  Pulse: 74  Temp: 97.8 F (36.6 C)  TempSrc: Oral  SpO2: 100%  Weight: 182 lb 11.2 oz (82.9 kg)  Height: 5\' 7"  (1.702 m)    General: Well-appearing man sitting up in a chair no distress Neck: No masses, no JVD, no thyromegaly ENT: Poor baseline mentation CV: Regular rate and rhythm, no murmurs Extremities: Warm and well perfused with no lower extremity edema or joint effusions.  Assessment/Plan:  See  Problem Based Charting in the Encounters Tab     Omar Castillo, Omar Thomas, MD  11/28/2017  10:50 AM

## 2017-11-28 NOTE — Assessment & Plan Note (Signed)
Stable anxiety symptoms, though not yet well controlled.  Significant social stressors right now, mother just entered inpatient hospice ago.  I refilled BuSpar 15 mg 3 times daily today.  Patient continues also to use 0.5-1 mg Xanax almost daily, though this is not a prescribed medication.

## 2017-11-28 NOTE — Assessment & Plan Note (Signed)
Severe opioid use disorder in a patient with a history of severe overdose.  He is been on treatment since September with Suboxone, receives this for free through pharmaceutical assistance program.  He had a relapse in mid February and started using heroin again.  He now reports that he is back in remission, has not used heroin in over 2 weeks.  He reports good benefit from going to [employer], now has a sponsor, and was recently baptized by a [employer].  We will plan to check a urine drug test at next visit.  I checked the controlled substance reporting system which was appropriate.  Plan to continue with Suboxone 8 mg films 3 times daily, I prescribed a 2-week supply.  I also gave the patient a naloxone kit with 2 doses of IM 0.4 mg vials to be used in case of emergent overdose.  Patient understands, he is grateful for help, will follow up with me in 2 weeks.

## 2017-12-12 ENCOUNTER — Other Ambulatory Visit: Payer: Self-pay

## 2017-12-12 ENCOUNTER — Ambulatory Visit (INDEPENDENT_AMBULATORY_CARE_PROVIDER_SITE_OTHER): Payer: Self-pay | Admitting: Internal Medicine

## 2017-12-12 DIAGNOSIS — F419 Anxiety disorder, unspecified: Secondary | ICD-10-CM

## 2017-12-12 DIAGNOSIS — F1120 Opioid dependence, uncomplicated: Secondary | ICD-10-CM

## 2017-12-12 MED ORDER — BUPRENORPHINE HCL-NALOXONE HCL 8-2 MG SL FILM: 1.0000 | ORAL_FILM | Freq: Three times a day (TID) | SUBLINGUAL | 0 refills | Status: DC

## 2017-12-12 NOTE — Patient Instructions (Signed)
Keep up the good work we will see you in 2 weeks

## 2017-12-12 NOTE — Assessment & Plan Note (Addendum)
Doing well in remission.  Will continue Suboxone 8-2 three times daily.  Continue 2 week follow up for now- slightly higher risk with mother in hospice currently.  Deferred UDS to next visit.

## 2017-12-12 NOTE — Progress Notes (Addendum)
   12/12/2017  Omar Castillo presents for follow up of opioid use disorder I have reviewed the prior induction visit, follow up visits, and telephone encounters relevant to opiate use disorder (OUD) treatment.   Current daily dose: Suboxone 24 mg daily  Date of Induction: 05/17/2017  Current follow up interval, in weeks: 2  The patient has not been adherent with the buprenorphine for OUD contract.   Last UDS Result: Inappropriate for polysubstances  HPI: 52 year old man here for follow-up of opioid use disorder.  Unfortunately the patient had relapse and began using heroin in mid February.  Though today he is telling me that he is doing very well, has not used heroin in over 5 weeks.  He says that he is compliant with Suboxone 3 times daily.  Reports that it is successful in managing withdrawal and improving cravings. He notes that he is still doing well despite his mothers decline. He notes doctors expect her to pass away within the week.  He has social support from hospice as well as he has recently started going to church.  Anxiety symptoms are stable, he still uses Xanax as needed for anxiety symptoms.    Exam:   Vitals:   12/12/17 1055  BP: (!) 147/79  Pulse: 79  Temp: 98.1 F (36.7 Castillo)  TempSrc: Oral  Weight: 184 lb (83.5 kg)  Height: 5\' 6"  (1.676 m)    General: Well-appearing man sitting up in a chair no distress CV: Regular rate and rhythm, no murmurs Lungs: CTAB Extremities: Warm and well perfused with no lower extremity edema or joint effusions, no track marks noted.  Assessment/Plan:  See Problem Based Charting in the Encounters Tab     Omar Castillo, Omar Horacek C, DO  12/12/2017  11:47 AM

## 2017-12-25 ENCOUNTER — Ambulatory Visit (INDEPENDENT_AMBULATORY_CARE_PROVIDER_SITE_OTHER): Payer: Self-pay | Admitting: Internal Medicine

## 2017-12-25 DIAGNOSIS — F1120 Opioid dependence, uncomplicated: Secondary | ICD-10-CM

## 2017-12-25 MED ORDER — BUPRENORPHINE HCL-NALOXONE HCL 8-2 MG SL FILM: 1.0000 | ORAL_FILM | Freq: Three times a day (TID) | SUBLINGUAL | 0 refills | Status: DC

## 2017-12-25 NOTE — Progress Notes (Signed)
   12/25/2017  Omar StainsJohnny L Castillo presents for follow up of opioid use disorder I have reviewed the prior induction visit, follow up visits, and telephone encounters relevant to opiate use disorder (OUD) treatment.   Current daily dose: Suboxone 24 mg daily  Date of Induction: 05/17/2017  Current follow up interval, in weeks: 2  The patient has not been adherent with the buprenorphine for OUD contract.   Last UDS Result: Inappropriate for polysubstances  HPI: 52 year old man here for follow-up of opioid use disorder.  He presents today for appointment 1 day before his scheduled appointment, he is tearful and notes that his mother has had another decline, [facility] now on "life [facility]," he notes the doctors plan to stop this tomorrow afternoon.  He was worried about not making his appointment so came in today.  In discussing this it seems he also wanted some comfort that this is the right thing to do.  He reports that before Thursday he had some really good days with him mom and feels this is right for her, he is frustrated at how hard it is to let go.  However he notes that he has been able to stay away from any herion use and is still adherent to suboxone MAT.   Exam:   Vitals:   12/25/17 1114  BP: 140/90  Pulse: 92  Temp: 98.3 F (36.8 C)  TempSrc: Oral  SpO2: 97%  Weight: 179 lb 9.6 oz (81.5 kg)  Height: 5\' 7"  (1.702 m)    General: Well-appearing man, tearful CV: Regular rate and rhythm, no murmurs Lungs: CTAB Extremities: Warm and well perfused with no lower extremity edema or joint effusions, no track marks noted.  Assessment/Plan:  See Problem Based Charting in the Encounters Tab     Gust RungHoffman, Ranell Skibinski C, DO  12/25/2017  1:25 PM

## 2017-12-25 NOTE — Assessment & Plan Note (Signed)
Continue Suboxone 8mg  TID.   We discussed it was great for him to notify us of liklihood of missing appointment.  Will plan to have him follow up in just over 2 weeks although discussed he can come in sooner if needed.

## 2017-12-25 NOTE — Patient Instructions (Signed)
Keep up the good work, call us if you need anything.

## 2018-01-09 ENCOUNTER — Ambulatory Visit (INDEPENDENT_AMBULATORY_CARE_PROVIDER_SITE_OTHER): Payer: Self-pay | Admitting: Student in an Organized Health Care Education/Training Program

## 2018-01-09 ENCOUNTER — Other Ambulatory Visit: Payer: Self-pay

## 2018-01-09 VITALS — BP 151/84 | HR 75 | Temp 98.3°F | Ht 67.0 in | Wt 185.3 lb

## 2018-01-09 DIAGNOSIS — K029 Dental caries, unspecified: Secondary | ICD-10-CM

## 2018-01-09 DIAGNOSIS — K0889 Other specified disorders of teeth and supporting structures: Secondary | ICD-10-CM

## 2018-01-09 DIAGNOSIS — F1120 Opioid dependence, uncomplicated: Secondary | ICD-10-CM

## 2018-01-09 DIAGNOSIS — G8929 Other chronic pain: Secondary | ICD-10-CM

## 2018-01-09 MED ORDER — BUPRENORPHINE HCL-NALOXONE HCL 8-2 MG SL FILM: 1.0000 | ORAL_FILM | Freq: Three times a day (TID) | SUBLINGUAL | 0 refills | Status: DC

## 2018-01-09 NOTE — Assessment & Plan Note (Signed)
Severe opioid use disorder currently in good stable remission.  Biggest risk to his remission right now is the stress going on with his mother's illness.  She is still in [facility]  We talked about to good compliance with Suboxone and the protective effects that we will have.  He is going to NA meetings 3 times weekly at the [facility]  I checked the controlled substance database today which was appropriate.  We obtained a urine tox screen today to evaluate compliance.  Follow-up in 4 weeks or sooner if needed.

## 2018-01-09 NOTE — Progress Notes (Signed)
   01/09/2018  Omar Castillo presents for follow up of opioid use disorder I have reviewed the prior induction visit, follow up visits, and telephone encounters relevant to opiate use disorder (OUD) treatment.   Current daily dose: Suboxone 24 mg  Date of Induction: 05/17/2018  Current follow up interval, in weeks: 4  The patient has been adherent with the buprenorphine for OUD contract.    HPI: 52 year-old man here for follow-up of severe opioid use disorder doing well on treatment with Suboxone.  Reports good compliance with medication, takes 2 films in the morning and 1 symptoms.  Says he has not had any relapses  since last seen.  He is going through a lot of difficulties right now because his mother is ill, she is on inpatient hospice now if he can place.  She is been there about 2 weeks so far, it sounds like she is pushing them to bring her back home but they recognize they do not have enough support for her right now.  He is fearful that after she passes away it will be a trigger for him to relapse.  He is going to Narcotics Anonymous meetings 3 times weekly at [employer] and says that it is a good group with good people.  Denies any fevers or chills.  Reports good exertional capacity.  Complains of chronic pain in his teeth, no drainage or concern for infection right now.    Exam:   Vitals:   01/09/18 0948  BP: (!) 151/84  Pulse: 75  Temp: 98.3 F (36.8 C)  SpO2: 99%  Weight: 185 lb 4.8 oz (84.1 kg)  Height:  (1.702 m)    General: Well-appearing man in no distress CV: regular rate and rhythm, no murmurs Mouth: multiple dental caries, multiple chipped teeth, no abscess or purulence. Skin:   Warm, no rash, no infection Ext: warm well perfused, no joint effusions, No edema Psych:  Normal affect, not depressed or anxious appearing  Assessment/Plan:  See Problem Based Charting in the Encounters Tab     [name], MD  01/09/2018  10:09 AM

## 2018-01-09 NOTE — Assessment & Plan Note (Signed)
Multiple dental caries, no signs of infection.  Patient would benefit from dental evaluation and multiple teeth extractions.  Gave him a list of providers that he could pay out of pocket for.  If he follows back up with  Omar Castillo, he could get the orange cardand then we can refer him to the dental clinic at the health department.

## 2018-01-09 NOTE — Patient Instructions (Signed)
We talked about finding a dentist today, I gave you a list of dentists that you may be able to pay cash to see. In order to be seen at the health department, you will need to complete the paperwork for the [facility] Card assistance program with [facility]. You can arrange that with her at our front desk.

## 2018-01-15 LAB — TOXASSURE SELECT,+ANTIDEPR,UR

## 2018-02-06 ENCOUNTER — Other Ambulatory Visit: Payer: Self-pay

## 2018-02-06 ENCOUNTER — Telehealth: Payer: Self-pay | Admitting: Internal Medicine

## 2018-02-06 ENCOUNTER — Ambulatory Visit (INDEPENDENT_AMBULATORY_CARE_PROVIDER_SITE_OTHER): Payer: Self-pay | Admitting: Internal Medicine

## 2018-02-06 VITALS — BP 160/91 | HR 101 | Temp 98.2°F | Ht 67.0 in | Wt 185.2 lb

## 2018-02-06 DIAGNOSIS — E119 Type 2 diabetes mellitus without complications: Secondary | ICD-10-CM

## 2018-02-06 DIAGNOSIS — I10 Essential (primary) hypertension: Secondary | ICD-10-CM

## 2018-02-06 DIAGNOSIS — Z79899 Other long term (current) drug therapy: Secondary | ICD-10-CM

## 2018-02-06 DIAGNOSIS — Z7984 Long term (current) use of oral hypoglycemic drugs: Secondary | ICD-10-CM

## 2018-02-06 DIAGNOSIS — E1165 Type 2 diabetes mellitus with hyperglycemia: Secondary | ICD-10-CM

## 2018-02-06 DIAGNOSIS — F1120 Opioid dependence, uncomplicated: Secondary | ICD-10-CM

## 2018-02-06 DIAGNOSIS — F411 Generalized anxiety disorder: Secondary | ICD-10-CM

## 2018-02-06 MED ORDER — METFORMIN HCL 500 MG PO TABS: 500.0000 mg | ORAL_TABLET | Freq: Two times a day (BID) | ORAL | 3 refills | Status: DC

## 2018-02-06 MED ORDER — FLUOXETINE HCL 20 MG PO CAPS: 20.0000 mg | ORAL_CAPSULE | Freq: Every day | ORAL | 3 refills | Status: DC

## 2018-02-06 MED ORDER — BUPRENORPHINE HCL-NALOXONE HCL 8-2 MG SL FILM: 1.0000 | ORAL_FILM | Freq: Three times a day (TID) | SUBLINGUAL | 0 refills | Status: DC

## 2018-02-06 MED ORDER — BUSPIRONE HCL 15 MG PO TABS: 15.0000 mg | ORAL_TABLET | Freq: Three times a day (TID) | ORAL | 2 refills | Status: DC

## 2018-02-06 MED ORDER — LISINOPRIL 20 MG PO TABS
20.0000 mg | ORAL_TABLET | Freq: Every day | ORAL | 3 refills | Status: DC
Start: 2018-02-06 — End: 2018-11-13

## 2018-02-06 NOTE — Telephone Encounter (Signed)
Patient having trouble getting medicine

## 2018-02-06 NOTE — Assessment & Plan Note (Signed)
A1c improved from 9.1-7.9, with metformin use. Refilled metformin today we will have him continue the 500 mg twice a day he did have some diarrhea with this usually but is currently tolerating well.  Recheck A1c at next visit.

## 2018-02-06 NOTE — Patient Instructions (Signed)
I want you to increase lisinopril to  a day.

## 2018-02-06 NOTE — Assessment & Plan Note (Signed)
Refill BuSpar and Prozac

## 2018-02-06 NOTE — Assessment & Plan Note (Signed)
We will check a urine tox today. Continue Suboxone 24 mg divided daily dose.

## 2018-02-06 NOTE — Progress Notes (Signed)
   02/06/2018  Omar Castillo presents for follow up of opioid use disorder I have reviewed the prior induction visit, follow up visits, and telephone encounters relevant to opiate use disorder (OUD) treatment.   Current daily dose: Suboxone 24 mg  Date of Induction: 05/17/2018  Current follow up interval, in weeks: 4  The patient has been adherent with the buprenorphine for OUD contract.   Last UDS: 01/09/18: Bupe present at low level, inappropriate for BZD and opioids  HPI: 52 year-old man here for follow-up of severe opioid use disorder doing well on treatment with Suboxone.  Reports good compliance with medication, takes 2 films in the morning and 1 at night.  His mother is back home the house he is given hospice care at home.  He notes it is been a struggle with his mother's health he does note relapses he reports his last relapse was of intranasal heroin in April.  He has continued to be adherent to Suboxone during this time.  He did not get a euphoric effect out of heroin use.  For his diabetes he reports that sugars have mostly been well controlled he did have one reading in the 300s when he missed his metformin he is mostly been adherent to 500 mg of metformin twice a day however sometimes he misses doses. He reports he did not get good sleep last night because his mother had "a bad night," he did not take his lisinopril or metformin this morning.  Exam:   Vitals:   02/06/18 0936  BP: (!) 160/91  Pulse: (!) 101  Temp: 98.2 F (36.8 C)  TempSrc: Oral  SpO2: 96%  Weight: 185 lb 3.2 oz (84 kg)  Height:  (1.702 m)    General: Well-appearing man in no distress HEENT: pupils pinpoint CV: regular rate and rhythm, no murmurs Skin:   Warm, no rash, no infection Ext: warm well perfused, no joint effusions, No edema Psych:  Normal affect, not depressed or anxious appearing  Assessment/Plan:  See Problem Based Charting in the Encounters Tab     Gust Rung,  DO  02/06/2018  9:39 AM

## 2018-02-06 NOTE — Telephone Encounter (Signed)
This has been resolved

## 2018-02-06 NOTE — Assessment & Plan Note (Signed)
Blood pressure elevated today did not take medication this morning however overall trend is above goal we will increase to lisinopril 20 mg daily.

## 2018-02-14 LAB — TOXASSURE SELECT,+ANTIDEPR,UR

## 2018-03-06 ENCOUNTER — Ambulatory Visit (INDEPENDENT_AMBULATORY_CARE_PROVIDER_SITE_OTHER): Payer: Self-pay | Admitting: Internal Medicine

## 2018-03-06 VITALS — BP 146/102 | HR 111 | Temp 98.2°F | Wt 182.6 lb

## 2018-03-06 DIAGNOSIS — E119 Type 2 diabetes mellitus without complications: Secondary | ICD-10-CM

## 2018-03-06 DIAGNOSIS — F1120 Opioid dependence, uncomplicated: Secondary | ICD-10-CM

## 2018-03-06 DIAGNOSIS — Z7984 Long term (current) use of oral hypoglycemic drugs: Secondary | ICD-10-CM

## 2018-03-06 DIAGNOSIS — E1165 Type 2 diabetes mellitus with hyperglycemia: Secondary | ICD-10-CM

## 2018-03-06 LAB — POCT GLYCOSYLATED HEMOGLOBIN (HGB A1C): Hemoglobin A1C: 9.4 % — AB (ref 4.0–5.6)

## 2018-03-06 LAB — GLUCOSE, CAPILLARY: GLUCOSE-CAPILLARY: 283 mg/dL — AB (ref 70–99)

## 2018-03-06 MED ORDER — GLIPIZIDE 5 MG PO TABS: 2.5000 mg | ORAL_TABLET | Freq: Every day | ORAL | 2 refills | Status: DC

## 2018-03-06 MED ORDER — BUPRENORPHINE HCL-NALOXONE HCL 8-2 MG SL FILM: 1.0000 | ORAL_FILM | Freq: Three times a day (TID) | SUBLINGUAL | 0 refills | Status: AC

## 2018-03-06 NOTE — Assessment & Plan Note (Signed)
The last 2 UDS is have been inappropriate we have discussed this.  He still does seem to be benefiting from Suboxone therapy and we will try to keep him in therapy.  I will continue Suboxone 8-2 mg films 3 films daily in divided doses.  He will follow-up in 4 weeks.

## 2018-03-06 NOTE — Patient Instructions (Signed)
Glipizide tablets What is this medicine? GLIPIZIDE (GLIP i zide) helps to treat type 2 diabetes. Treatment is combined with diet and exercise. The medicine helps your body to use insulin better. This medicine may be used for other purposes; ask your health care provider or pharmacist if you have questions. COMMON BRAND NAME(S): Glucotrol What should I tell my health care provider before I take this medicine? They need to know if you have any of these conditions: -diabetic ketoacidosis -glucose-6-phosphate dehydrogenase deficiency -heart disease -kidney disease -liver disease -porphyria -severe infection or injury -thyroid disease -an unusual or allergic reaction to glipizide, sulfa drugs, other medicines, foods, dyes, or preservatives -pregnant or trying to get pregnant -breast-feeding How should I use this medicine? Take this medicine by mouth. Swallow with a drink of water. Do not take with food. Take it 30 minutes before a meal. Follow the directions on the prescription label. If you take this medicine once a day, take it 30 minutes before breakfast. Take your doses at the same time each day. Do not take more often than directed. Talk to your pediatrician regarding the use of this medicine in children. Special care may be needed. Elderly patients over 65 years old may have a stronger reaction and need a smaller dose. Overdosage: If you think you have taken too much of this medicine contact a poison control center or emergency room at once. NOTE: This medicine is only for you. Do not share this medicine with others. What if I miss a dose? If you miss a dose, take it as soon as you can. If it is almost time for your next dose, take only that dose. Do not take double or extra doses. What may interact with this medicine? -bosentan -chloramphenicol -cisapride -clarithromycin -medicines for fungal or yeast infections -metoclopramide -probenecid -warfarin Many medications may cause an  increase or decrease in blood sugar, these include: -alcohol containing beverages -aspirin and aspirin-like drugs -chloramphenicol -chromium -diuretics -male hormones, like estrogens or progestins and birth control pills -heart medicines -isoniazid -male hormones or anabolic steroids -medicines for weight loss -medicines for allergies, asthma, cold, or cough -medicines for mental problems -medicines called MAO Inhibitors like Nardil, Parnate, Marplan, Eldepryl -niacin -NSAIDs, medicines for pain and inflammation, like ibuprofen or naproxen -pentamidine -phenytoin -probenecid -quinolone antibiotics like ciprofloxacin, levofloxacin, ofloxacin -some herbal dietary supplements -steroid medicines like prednisone or cortisone -thyroid medicine This list may not describe all possible interactions. Give your health care provider a list of all the medicines, herbs, non-prescription drugs, or dietary supplements you use. Also tell them if you smoke, drink alcohol, or use illegal drugs. Some items may interact with your medicine. What should I watch for while using this medicine? Visit your doctor or health care professional for regular checks on your progress. A test called the HbA1C (A1C) will be monitored. This is a simple blood test. It measures your blood sugar control over the last 2 to 3 months. You will receive this test every 3 to 6 months. Learn how to check your blood sugar. Learn the symptoms of low and high blood sugar and how to manage them. Always carry a quick-source of sugar with you in case you have symptoms of low blood sugar. Examples include hard sugar candy or glucose tablets. Make sure others know that you can choke if you eat or drink when you develop serious symptoms of low blood sugar, such as seizures or unconsciousness. They must get medical help at once. Tell your doctor or health   care professional if you have high blood sugar. You might need to change the dose of  your medicine. If you are sick or exercising more than usual, you might need to change the dose of your medicine. Do not skip meals. Ask your doctor or health care professional if you should avoid alcohol. Many nonprescription cough and cold products contain sugar or alcohol. These can affect blood sugar. This medicine can make you more sensitive to the sun. Keep out of the sun. If you cannot avoid being in the sun, wear protective clothing and use sunscreen. Do not use sun lamps or tanning beds/booths. Wear a medical ID bracelet or chain, and carry a card that describes your disease and details of your medicine and dosage times. What side effects may I notice from receiving this medicine? Side effects that you should report to your doctor or health care professional as soon as possible: -allergic reactions like skin rash, itching or hives, swelling of the face, lips, or tongue -breathing problems -dark urine -fever, chills, sore throat -signs and symptoms of low blood sugar such as feeling anxious, confusion, dizziness, increased hunger, unusually weak or tired, sweating, shakiness, cold, irritable, headache, blurred vision, fast heartbeat, loss of consciousness -unusual bleeding or bruising -yellowing of the eyes or skin Side effects that usually do not require medical attention (report to your doctor or health care professional if they continue or are bothersome): -diarrhea -dizziness -headache -heartburn -nausea -stomach gas This list may not describe all possible side effects. Call your doctor for medical advice about side effects. You may report side effects to FDA at 1-800-FDA-1088. Where should I keep my medicine? Keep out of the reach of children. Store at room temperature below 30 degrees C (86 degrees F). Throw away any unused medicine after the expiration date. NOTE: This sheet is a summary. It may not cover all possible information. If you have questions about this medicine, talk  to your doctor, pharmacist, or health care provider.  2018 Elsevier/Gold Standard (2012-12-12 14:42:46)  

## 2018-03-06 NOTE — Progress Notes (Signed)
   03/06/2018  Omar Castillo presents for follow up of opioid use disorder I have reviewed the prior induction visit, follow up visits, and telephone encounters relevant to opiate use disorder (OUD) treatment.   Current daily dose: Suboxone 24 mg  Date of Induction: 05/17/2018  Current follow up interval, in weeks: 4  The patient has been adherent with the buprenorphine for OUD contract.   Last UDS: 02/06/18: Bupe present at low level, inappropriate for BZD and opioids  HPI: 52 year-old man here for follow-up of severe opioid use disorder doing well on treatment with Suboxone.  Reports good compliance with medication, takes 2 films in the morning and 1 at night.  He reports that the past month has continued to be a little trying for him he does report one lapse with use of opioid medication last week but overall thinks he is doing better.  He reports this was due to finding out that his mother would need to go back into the hospital.  He notes that she is currently hospitalized.  He feels that he is doing well on Suboxone therapy and for the most part controls his cravings he is not quite sure why he goes back to heroin use except the stress of his mother's health issues. For his diabetes he notes that his sugars have been higher lately.  He does not note any change in symptoms overall except that he is urinating a little more frequently.  He has been checking his sugars pretty regularly however occasionally his sugars have simply read "high."  He reports use of metformin 1000 mg twice a day he rarely misses doses of this.  He is still eating some high carb meals such as peanut butter sandwiches however he reports he is [facility].  He does not have any changes in vision no chest pain no shortness of breath.  No fever no chills no diarrhea.  Exam:   Vitals:   03/06/18 0832  BP: (!) 146/102  Pulse: (!) 111  Temp: 98.2 F (36.8 C)  TempSrc: Oral  SpO2: 96%  Weight: 182 lb 9.6 oz  (82.8 kg)    General: Well-appearing man in no distress CV: regular rate and rhythm, no murmurs Skin:   Warm, no rash, no infection Ext: warm well perfused, no joint effusions, No edema Psych:  Normal affect, not depressed or anxious appearing   Assessment/Plan:  See Problem Based Charting in the Encounters Tab     Gust RungHoffman, Omar C, DO  03/06/2018  1:16 PM

## 2018-03-06 NOTE — Assessment & Plan Note (Signed)
-  Continue Metformin 1g BID - Add Glizipide 2.5mg  with breakfast, after 1 week if sugars remain high will increase to BID with meals.

## 2018-03-14 LAB — TOXASSURE SELECT,+ANTIDEPR,UR

## 2018-04-03 NOTE — Progress Notes (Deleted)
   04/03/2018  Harlan StainsJohnny L Castillo presents for follow up of opioid use disorder I have reviewed the prior induction visit, follow up visits, and telephone encounters relevant to opiate use disorder (OUD) treatment.   Current daily dose: ***  Date of Induction: ***  Current follow up interval, in weeks: ***  The patient {Has/has not:18111} been adherent with the buprenorphine for OUD contract.   Last UDS Result: ***  HPI: ***  Exam:   There were no vitals filed for this visit.  ***  Assessment/Plan:  See Problem Based Charting in the Encounters Tab     Lorenso Courierhundi, Serenna Deroy, MD  04/03/2018  8:31 AM

## 2018-04-10 ENCOUNTER — Other Ambulatory Visit: Payer: Self-pay

## 2018-04-10 ENCOUNTER — Ambulatory Visit (INDEPENDENT_AMBULATORY_CARE_PROVIDER_SITE_OTHER): Payer: Self-pay | Admitting: Internal Medicine

## 2018-04-10 VITALS — BP 137/82 | HR 105 | Temp 98.4°F | Ht 67.0 in | Wt 181.6 lb

## 2018-04-10 DIAGNOSIS — F4321 Adjustment disorder with depressed mood: Secondary | ICD-10-CM

## 2018-04-10 DIAGNOSIS — I10 Essential (primary) hypertension: Secondary | ICD-10-CM

## 2018-04-10 DIAGNOSIS — F411 Generalized anxiety disorder: Secondary | ICD-10-CM

## 2018-04-10 DIAGNOSIS — M47816 Spondylosis without myelopathy or radiculopathy, lumbar region: Secondary | ICD-10-CM

## 2018-04-10 DIAGNOSIS — Z79899 Other long term (current) drug therapy: Secondary | ICD-10-CM

## 2018-04-10 DIAGNOSIS — E119 Type 2 diabetes mellitus without complications: Secondary | ICD-10-CM

## 2018-04-10 DIAGNOSIS — F1120 Opioid dependence, uncomplicated: Secondary | ICD-10-CM

## 2018-04-10 MED ORDER — BUPRENORPHINE HCL-NALOXONE HCL 8-2 MG SL FILM: 1.0000 | ORAL_FILM | Freq: Three times a day (TID) | SUBLINGUAL | 0 refills | Status: DC

## 2018-04-10 MED ORDER — CLONAZEPAM 0.25 MG PO TBDP: 0.2500 mg | ORAL_TABLET | Freq: Two times a day (BID) | ORAL | 0 refills | Status: DC

## 2018-04-10 NOTE — Assessment & Plan Note (Signed)
The patient had a recent relapse 3 days ago with heroin. The patient has worsened stressors with his mother dying 6 days ago. He continues to be well motivated in wanting to continue suboxone therapy. He states that he wants to remain on therapy for both himself as well as his mother.  He had some withdrawal symptoms today-anxiety and tachycardia secondary to likely withdrawal from not having enough suboxone. The patient continues to remain a good candidate for therapy and has good motivation. Review of [place] controlled substance shows appropriate use.   -Refilled suboxone 24mg  daily.  -Ordered toxassure -Will follow up with patient in 2 weeks as he is at higher risk for relapse with his mother's recent death

## 2018-04-10 NOTE — Addendum Note (Signed)
Addended by: Erlinda HongVINCENT, Glenice Ciccone T on: 04/10/2018 10:41 AM   Modules accepted: Orders

## 2018-04-10 NOTE — Progress Notes (Signed)
Internal Medicine Clinic Attending  I saw and evaluated the patient.  I personally confirmed the key portions of the history and exam documented by Dr. Delma Officerhundi and I reviewed pertinent patient test results.  The assessment, diagnosis, and plan were formulated together and I agree with the documentation in the resident's note.  Severe OUD currently uncontrolled, in relapse, complicated by acute grief from mother's death. We talked about his risk for overdose during this time of relapse. He reports good compliance with suboxone which should be protective. Will see him back closely in 2 weeks.

## 2018-04-10 NOTE — Assessment & Plan Note (Signed)
Xanax was detected on the patient's tox assure.  Discussed with patient about Xanax use and he states that he uses 0.5 to 1 mg to relax him.  He states that he has some benefit from Prozac and BuSpar but he still feels that Xanax promote relaxes him and improves his anxiety.  Due to the patient being in acute grief will send a 14-day prescription of clonazepam 0.25 mg twice daily.  -Clonazepam 0.25 mg twice daily prescribed for 2 weeks

## 2018-04-10 NOTE — Progress Notes (Signed)
   04/10/2018  Omar Castillo presents for follow up of opioid use disorder I have reviewed the prior induction visit, follow up visits, and telephone encounters relevant to opiate use disorder (OUD) treatment.   Current daily dose: Suboxone 24mg    Date of Induction: 05/17/18  Current follow up interval, in weeks: 4 weeks  The patient has not been adherent with the buprenorphine for OUD contract.   Last UDS Result: 03/06/2018 from present with alprazolam, oxycodone, and fentanyl  HPI: [name] is a 52 year old male with hypertension, diabetes mellitus type 2, osteoarthritis or lumbar spine, generalized anxiety disorder, and opioid use disorder who is present today for oud follow up. The patient missed follow appointment last Tuesday 04/03/18 his mother not feeling well.  Unfortunately, the patient's mother passed away on Wednesday, 04/04/2018 and he has understandably been having a hard time since then.  The patient states that he used heroin on Friday, 04/07/2018.  Due to not having enough suboxone left he has spread out his medications over the past few days.  Exam:   Vitals:   04/10/18 0936  BP: 137/82  Pulse: (!) 105  Temp: 98.4 F (36.9 C)  TempSrc: Oral  SpO2: 100%  Weight: 181 lb 9.6 oz (82.4 kg)  Height: 5\' 7"  (1.702 m)   Physical Exam  Constitutional: He appears well-developed and well-nourished. No distress.  HENT:  Head: Normocephalic and atraumatic.  Eyes: Conjunctivae are normal.  Cardiovascular: Normal rate, regular rhythm and normal heart sounds.  Respiratory: Effort normal and breath sounds normal. No respiratory distress. He has no wheezes.  GI: Soft. Bowel sounds are normal. He exhibits no distension. There is no tenderness.  Musculoskeletal: He exhibits no edema.  Neurological: He is alert.  Skin: He is not diaphoretic.  Small 1cm areas of excoriations present on anterior aspect of bilateral lower extremities  Psychiatric: His speech is normal. Judgment and  thought content normal. His affect is blunt. He is slowed. He exhibits a depressed mood.     Assessment/Plan:  See Problem Based Charting in the Encounters Tab     Lorenso Courierhundi, Cary Lothrop, MD  04/10/2018  9:37 AM

## 2018-04-10 NOTE — Patient Instructions (Addendum)
It was a pleasure to see you today Mr. [name]. Please make the following changes:   Hard to hear about the stressful situation that she has been going through.  We will work with you closely to report you.   Continue using Suboxone 24 mg daily Please use clonazepam 0.25 mg twice daily for the next 2 weeks, PLEASE STOP USING XANAX Please follow-up with us in 2 weeks  If you have any questions or concerns, please call our clinic at [phone] between 9am-5pm and after hours call [phone] and ask for the internal medicine resident on call. If you feel you are having a medical emergency please call 911.   Thank you, we look forward to help you remain healthy!  Lorenso CourierVahini Anushree Dorsi, MD Internal Medicine PGY 2

## 2018-04-15 LAB — TOXASSURE SELECT,+ANTIDEPR,UR

## 2018-04-24 ENCOUNTER — Telehealth: Payer: Self-pay

## 2018-04-24 NOTE — Telephone Encounter (Signed)
Rtc, lm for rtc 

## 2018-04-24 NOTE — Telephone Encounter (Signed)
Needs to speak with a nurse about OUD appt. Please call pt back.  

## 2018-04-25 ENCOUNTER — Other Ambulatory Visit: Payer: Self-pay | Admitting: Student in an Organized Health Care Education/Training Program

## 2018-04-25 NOTE — Telephone Encounter (Signed)
Called pt, spoke w/ him, he states he was moving his aunt from [place] down here to live with him and missed his appt 8/13, he is scheduled for 8/22 oud, could we please fill his suboxone until then? [facility] [address]

## 2018-04-26 MED ORDER — BUPRENORPHINE HCL-NALOXONE HCL 8-2 MG SL FILM: 1.0000 | ORAL_FILM | Freq: Three times a day (TID) | SUBLINGUAL | 0 refills | Status: DC

## 2018-04-26 NOTE — Telephone Encounter (Signed)
Ok. This is a one time only, one week refill. He must come to OUD visit as scheduled on 8/22, and let us know if advance in the future if he is going to miss an appointment.

## 2018-05-03 ENCOUNTER — Other Ambulatory Visit: Payer: Self-pay

## 2018-05-03 ENCOUNTER — Ambulatory Visit (INDEPENDENT_AMBULATORY_CARE_PROVIDER_SITE_OTHER): Payer: Self-pay | Admitting: Internal Medicine

## 2018-05-03 VITALS — BP 142/80 | HR 88 | Temp 98.6°F | Ht 67.0 in | Wt 176.2 lb

## 2018-05-03 DIAGNOSIS — E118 Type 2 diabetes mellitus with unspecified complications: Secondary | ICD-10-CM

## 2018-05-03 DIAGNOSIS — F1120 Opioid dependence, uncomplicated: Secondary | ICD-10-CM

## 2018-05-03 DIAGNOSIS — E1165 Type 2 diabetes mellitus with hyperglycemia: Secondary | ICD-10-CM

## 2018-05-03 MED ORDER — BUPRENORPHINE HCL-NALOXONE HCL 8-2 MG SL FILM: 1.0000 | ORAL_FILM | Freq: Three times a day (TID) | SUBLINGUAL | 0 refills | Status: DC

## 2018-05-03 NOTE — Progress Notes (Signed)
   05/03/2018  Omar Castillo presents for follow up of opioid use disorder I have reviewed the prior induction visit, follow up visits, and telephone encounters relevant to opiate use disorder (OUD) treatment.   Current daily dose: 24mg  of buprenorphine  Date of Induction: 05/17/18  Current follow up interval, in weeks: 2 weeks  The patient has been adherent with the buprenorphine for OUD contract.   Last UDS Result: multiple opiates, also with buprenorphine and alprazolam  HPI: [name] is a 52yo man with severe OUD who presents today for follow up.  He had a relapse following the death of his mother.  He unfortunately had to miss his 2 week follow up due to family commitments.  We discussed this today and I advised him to call the clinic if he needs to miss an appointment.  He reports doing well, and not having any further lapses.  He denies any opiate use.  He is doing better with his grief and focuses on the positives of his mothers last days.  It has also helped to keep him busy [place].  He feels that once she gets here, he will start going to NA meetings and she has offered to go with him.  She seems supportive of his sobriety.    Exam:   Vitals:   05/03/18 0838  BP: (!) 142/80  Pulse: 88  Temp: 98.6 F (37 C)  TempSrc: Oral  SpO2: 96%  Weight: 176 lb 3.2 oz (79.9 kg)  Height: 5\' 7"  (1.702 m)    General: Awake, alert, NAD Eyes: Anicteric sclerae Pulm: Breathing comfortably, no audible wheezing Psych: Pleasant, normal behavior.  Assessment/Plan:  See Problem Based Charting in the Encounters Tab     Omar Castillo, Omar B, MD  05/03/2018  9:28 AM

## 2018-05-03 NOTE — Patient Instructions (Signed)
Dean - -  Thank you for coming in to see us today.  Keep focused on the positive things like your aunt coming to stay with you.   Once you and your aunt are settled, I think it would be very helpful for you to attend NA meetings.  Let me know how we can help you with this.    Thank you!

## 2018-05-04 ENCOUNTER — Encounter: Payer: Self-pay | Admitting: Internal Medicine

## 2018-05-04 NOTE — Assessment & Plan Note (Signed)
He did not bring in a blood sugar log today.  He will need to bring in his blood sugar log at next PCP visit for further evaluation of his DM and discussion of appropriateness to increase glipizide.

## 2018-05-04 NOTE — Assessment & Plan Note (Signed)
Refill history is appropriate.  He reports no further use of illicit substances and plans to start attending NA meetings.    Plan U tox today 4 week visits Continued supportive care.

## 2018-05-11 LAB — TOXASSURE SELECT,+ANTIDEPR,UR

## 2018-05-16 ENCOUNTER — Telehealth: Payer: Self-pay | Admitting: Student in an Organized Health Care Education/Training Program

## 2018-05-16 MED ORDER — BUPRENORPHINE HCL-NALOXONE HCL 8-2 MG SL FILM: 1.0000 | ORAL_FILM | Freq: Three times a day (TID) | SUBLINGUAL | 0 refills | Status: DC

## 2018-05-16 NOTE — Telephone Encounter (Signed)
I spoke with Omar Castillo, he missed our OUD appt yesterday. Sadly his sister committed suicide yesterday. She had a history of bipolar disease and presumably this was a reaction to the death of her mother. Omar Castillo is appropriately shocked and grieving. No relapses yet, but we talked about how this is a very high risk time for him right now. I encouraged him to continue using the suboxone to protect himself from overdose. He reports he wants to keep on treatment, he understands the dangers to himself of relapse. I have sent a 14 day prescription for him.   Purnell Shoemaker, can you please schedule him for OUD clinic on 9/17?

## 2018-05-24 ENCOUNTER — Other Ambulatory Visit: Payer: Self-pay | Admitting: Internal Medicine

## 2018-05-24 DIAGNOSIS — F411 Generalized anxiety disorder: Secondary | ICD-10-CM

## 2018-05-24 MED ORDER — BUSPIRONE HCL 15 MG PO TABS: 15.0000 mg | ORAL_TABLET | Freq: Three times a day (TID) | ORAL | 2 refills | Status: AC

## 2018-05-24 NOTE — Telephone Encounter (Signed)
Refill Approved

## 2018-05-24 NOTE — Telephone Encounter (Signed)
Need refills on anxiety @ [facility] on [address] ;pt contact# [phone]

## 2018-05-29 ENCOUNTER — Other Ambulatory Visit: Payer: Self-pay

## 2018-05-29 ENCOUNTER — Ambulatory Visit (INDEPENDENT_AMBULATORY_CARE_PROVIDER_SITE_OTHER): Payer: Self-pay | Admitting: Internal Medicine

## 2018-05-29 VITALS — BP 136/81 | HR 79 | Temp 98.1°F | Ht 66.0 in | Wt 175.1 lb

## 2018-05-29 DIAGNOSIS — Z79899 Other long term (current) drug therapy: Secondary | ICD-10-CM

## 2018-05-29 DIAGNOSIS — I10 Essential (primary) hypertension: Secondary | ICD-10-CM

## 2018-05-29 DIAGNOSIS — F1120 Opioid dependence, uncomplicated: Secondary | ICD-10-CM

## 2018-05-29 MED ORDER — BUPRENORPHINE HCL-NALOXONE HCL 8-2 MG SL FILM: 1.0000 | ORAL_FILM | Freq: Three times a day (TID) | SUBLINGUAL | 0 refills | Status: DC

## 2018-05-29 NOTE — Patient Instructions (Signed)
[name] - -  Please come back in 2 weeks.  Please continue your buprenorphine as prescribed.    Thank you!  Please call us if you are feeling any worsening mood or feelings of hurting yourself so that we can help you.

## 2018-05-29 NOTE — Progress Notes (Signed)
   05/29/2018  Omar Castillo presents for follow up of opioid use disorder I have reviewed the prior induction visit, follow up visits, and telephone encounters relevant to opiate use disorder (OUD) treatment.   Current daily dose: 24mg  of buprenorphine  Date of Induction: 05/17/17  Current follow up interval, in weeks: 1-2 weeks  The patient has not been adherent with the buprenorphine for OUD contract.   Last UDS Result: No buprenorphine, + morphine (heroin), xanax  HPI: [name] is a 52yo man here for follow up of his OUD.  He has been struggling with relapse, his mother died recently and also his sister committed suicide.  He reported at last visit that he was taking the buprenorphine and not taking heroin, but his UDS showed heroin and xanax and no buprenorphine.  Today he reports that he had definitely been taking his buprenorphine during that time and is not sure what happened.  He notes that he has been doing well, only used once in the last 2 weeks.  He is continuing to grieve for his sister and now is angry that she left him during this time.  His Aunt has moved to be closer to him and his remaining brother and is helping him cope somewhat.  We spent a lot of time discussing grief counseling and possible survivor support groups.  He feels he is not ready for a group yet, but that he does feel better talking about his stress.    Exam:   Vitals:   05/29/18 0838  BP: 136/81  Pulse: 79  Temp: 98.1 F (36.7 C)  TempSrc: Oral  SpO2: 97%  Weight: 175 lb 1.6 oz (79.4 kg)  Height: 5\' 6"  (1.676 m)    General: Tearful, depressed affect Eyes: Anicteric sclerae, mild conjunctival injection Pulm: Breathing comfortably, no wheezing Skin: No erythema or track marks on arms  Assessment/Plan:  See Problem Based Charting in the Encounters Tab     Inez CatalinaMullen, Emily B, MD  05/29/2018  8:54 AM

## 2018-05-29 NOTE — Assessment & Plan Note (Signed)
Reports doing overall well.  He had one lapse and use of heroin in the last 2 weeks.  He has had increased stress with the death of his mother and sister.  We discussed again his risk or relapse and overdose and he is aware of the risk.  We set a goal to have no further relapses in the next 2 weeks.   Continue current TID dosing of buprenorphine to decrease cravings Utox today [place] narcotic database appropriate Follow up in 2 weeks.  New medication management agreement signed.

## 2018-05-29 NOTE — Assessment & Plan Note (Signed)
BP is relatively well controlled today at 136/81.  He reports taking his lisinopril with good results and no adverse events  Plan Continue lisinopril 20mg  daily Advised him to schedule an appointment with his PCP.  Consider checking BMET at next visit or with PCP

## 2018-06-04 LAB — TOXASSURE SELECT,+ANTIDEPR,UR

## 2018-06-12 ENCOUNTER — Other Ambulatory Visit: Payer: Self-pay

## 2018-06-12 ENCOUNTER — Ambulatory Visit (INDEPENDENT_AMBULATORY_CARE_PROVIDER_SITE_OTHER): Payer: Self-pay | Admitting: Student in an Organized Health Care Education/Training Program

## 2018-06-12 VITALS — BP 136/74 | HR 78 | Temp 98.1°F | Ht 66.0 in | Wt 172.9 lb

## 2018-06-12 DIAGNOSIS — Z972 Presence of dental prosthetic device (complete) (partial): Secondary | ICD-10-CM

## 2018-06-12 DIAGNOSIS — Z79899 Other long term (current) drug therapy: Secondary | ICD-10-CM

## 2018-06-12 DIAGNOSIS — Z7984 Long term (current) use of oral hypoglycemic drugs: Secondary | ICD-10-CM

## 2018-06-12 DIAGNOSIS — I10 Essential (primary) hypertension: Secondary | ICD-10-CM

## 2018-06-12 DIAGNOSIS — F1120 Opioid dependence, uncomplicated: Secondary | ICD-10-CM

## 2018-06-12 DIAGNOSIS — E119 Type 2 diabetes mellitus without complications: Secondary | ICD-10-CM

## 2018-06-12 DIAGNOSIS — E1165 Type 2 diabetes mellitus with hyperglycemia: Secondary | ICD-10-CM

## 2018-06-12 DIAGNOSIS — K08109 Complete loss of teeth, unspecified cause, unspecified class: Secondary | ICD-10-CM

## 2018-06-12 DIAGNOSIS — E118 Type 2 diabetes mellitus with unspecified complications: Secondary | ICD-10-CM

## 2018-06-12 LAB — POCT GLYCOSYLATED HEMOGLOBIN (HGB A1C): Hemoglobin A1C: 11.2 % — AB (ref 4.0–5.6)

## 2018-06-12 LAB — GLUCOSE, CAPILLARY: GLUCOSE-CAPILLARY: 331 mg/dL — AB (ref 70–99)

## 2018-06-12 MED ORDER — BUPRENORPHINE HCL-NALOXONE HCL 8-2 MG SL FILM: 1.0000 | ORAL_FILM | Freq: Three times a day (TID) | SUBLINGUAL | 0 refills | Status: DC

## 2018-06-12 MED ORDER — GLIPIZIDE 5 MG PO TABS: 5.0000 mg | ORAL_TABLET | Freq: Two times a day (BID) | ORAL | 2 refills | Status: DC

## 2018-06-12 MED ORDER — METFORMIN HCL 1000 MG PO TABS: 1000.0000 mg | ORAL_TABLET | Freq: Two times a day (BID) | ORAL | 2 refills | Status: DC

## 2018-06-12 NOTE — Assessment & Plan Note (Signed)
Blood pressure well controlled.  Continue with lisinopril 20 mg daily.  Check renal function today.

## 2018-06-12 NOTE — Assessment & Plan Note (Addendum)
In recent relapse, but making improvements.  He reports good compliance over the last 2 weeks.  Cravings are much better controlled.  I think his acute grief is improving with time.  Plan is to continue with Suboxone 24 mg daily divided doses.  Patient is uninsured, she is medications to the pharmacy assistance program which should be ending this month.  After that we will have to transition to generic dosing through the [facility] outpatient pharmacy.  Urine substance test at next visit.  Follow-up in 4 weeks.  We updated the treatment agreement today.

## 2018-06-12 NOTE — Progress Notes (Signed)
   06/12/2018  Omar Castillo presents for follow up of opioid use disorder I have reviewed the prior induction visit, follow up visits, and telephone encounters relevant to opiate use disorder (OUD) treatment.   Current daily dose: Suboxone 24 mg daily  Date of Induction: 05/17/17  Current follow up interval, in weeks: 2-4  The patient has been adherent with the buprenorphine for OUD contract.   Last UDS Result: Polysubstances present  HPI: 52 year old male here for follow-up of opioid use disorder.  He has been struggling over the last 2 months because of several close deaths in his family.  Since we last saw him 2 weeks ago he reports good improvement.  Says that his mood has been getting better.  Reports depression symptoms are much improved.  Reports good compliance with Suboxone, good control of his cravings.  Denies any relapses, denies using other substances over the last 2 weeks.  No illicit Xanax use over the last 1 week.  He reports getting all of his teeth pulled and did not require extra pain medications.  Feeling a lot better.  Getting used to dentures.  Has a little more family supports, aunt is visiting.  He says that he is in a good place right now.  Exam:   Vitals:   06/12/18 0859  BP: 136/74  Pulse: 78  Temp: 98.1 F (36.7 C)  TempSrc: Oral  SpO2: 99%  Weight: 172 lb 14.4 oz (78.4 kg)  Height: 5\' 6"  (1.676 m)    General: Well-appearing male, no distress CV: Regular rate and rhythm with no murmurs ENT: All his teeth are pulled, no drainage or redness Skin: No rashes Ext: Warm and well-perfused with no edema Psych: Appropriate affect, not depressed or anxious appearing, well put together  Assessment/Plan:  See Problem Based Charting in the Encounters Tab     [name], MD  06/12/2018  9:26 AM

## 2018-06-12 NOTE — Assessment & Plan Note (Addendum)
Uncontrolled with A1c of 11.2%.  Medication noncompliance.  Talked about better compliance with 1000 mg twice a day and increase glipizide to 5 mg twice a day.

## 2018-06-12 NOTE — Patient Instructions (Signed)
Your diabetes is out of control.  Please take 1000 mg of metformin twice a day and 5 mg of glipizide twice a day.  I sent in new prescriptions for you.

## 2018-06-13 LAB — BMP8+ANION GAP
Anion Gap: 13 mmol/L (ref 10.0–18.0)
BUN / CREAT RATIO: 12 (ref 9–20)
BUN: 9 mg/dL (ref 6–24)
CHLORIDE: 98 mmol/L (ref 96–106)
CO2: 26 mmol/L (ref 20–29)
Calcium: 9.4 mg/dL (ref 8.7–10.2)
Creatinine, Ser: 0.77 mg/dL (ref 0.76–1.27)
GFR calc non Af Amer: 106 mL/min/{1.73_m2} (ref >59)
GFR, EST AFRICAN AMERICAN: 122 mL/min/{1.73_m2} (ref >59)
GLUCOSE: 344 mg/dL — AB (ref 65–99)
Potassium: 5 mmol/L (ref 3.5–5.2)
Sodium: 137 mmol/L (ref 134–144)

## 2018-07-10 ENCOUNTER — Ambulatory Visit: Payer: Self-pay

## 2018-08-14 ENCOUNTER — Other Ambulatory Visit: Payer: Self-pay

## 2018-08-14 ENCOUNTER — Ambulatory Visit (INDEPENDENT_AMBULATORY_CARE_PROVIDER_SITE_OTHER): Payer: Self-pay | Admitting: Internal Medicine

## 2018-08-14 VITALS — BP 153/84 | HR 77 | Temp 98.1°F | Ht 67.0 in | Wt 173.3 lb

## 2018-08-14 DIAGNOSIS — F329 Major depressive disorder, single episode, unspecified: Secondary | ICD-10-CM

## 2018-08-14 DIAGNOSIS — F1120 Opioid dependence, uncomplicated: Secondary | ICD-10-CM

## 2018-08-14 DIAGNOSIS — E119 Type 2 diabetes mellitus without complications: Secondary | ICD-10-CM

## 2018-08-14 DIAGNOSIS — F1123 Opioid dependence with withdrawal: Secondary | ICD-10-CM

## 2018-08-14 DIAGNOSIS — I10 Essential (primary) hypertension: Secondary | ICD-10-CM

## 2018-08-14 DIAGNOSIS — F411 Generalized anxiety disorder: Secondary | ICD-10-CM

## 2018-08-14 DIAGNOSIS — Z79899 Other long term (current) drug therapy: Secondary | ICD-10-CM

## 2018-08-14 MED ORDER — BUPRENORPHINE HCL-NALOXONE HCL 8-2 MG SL SUBL: 1.0000 | SUBLINGUAL_TABLET | Freq: Two times a day (BID) | SUBLINGUAL | 0 refills | Status: DC

## 2018-08-14 MED ORDER — FLUOXETINE HCL 20 MG PO CAPS: 20.0000 mg | ORAL_CAPSULE | Freq: Every day | ORAL | 3 refills | Status: AC

## 2018-08-14 MED FILL — BUPRENORPHIN-NALOXON 8-2 MG: 8-2 | 14 days supply | Qty: 28 | Fill #0

## 2018-08-14 NOTE — Patient Instructions (Signed)
I want you to start back on prozac.  For your suboxone you are switching to the tablet form.  I am changing your dose to just twice a day.

## 2018-08-14 NOTE — Assessment & Plan Note (Addendum)
Again has relapsed, he continues to struggle with anxiety and depression after passing of his mother.  New barrier is financial with ending of pharmacy assistance program.  He thinks he can afford abou 25$ for a 2 week supply.  Will decrease his dose to Suboxone 8-2mg  tablets twice a day for TDD of 16mg .  [facility]. Plan to follow up in 2 weeks, we may extend this out to 4 weeks if he is doing well on the reduced dose. Check UDS [place]ed [facility] appropraite, last fill of suboxone 06-12-18 consistent with history

## 2018-08-14 NOTE — Addendum Note (Signed)
Addended by: Carlynn PurlHOFFMAN, [name] on: 08/14/2018 12:27 PM   Modules accepted: Orders

## 2018-08-14 NOTE — Progress Notes (Signed)
   08/14/2018  Omar Castillo presents for follow up of opioid use disorder I have reviewed the prior induction visit, follow up visits, and telephone encounters relevant to opiate use disorder (OUD) treatment.   Current daily dose: Suboxone 24 mg daily>>> decrease to 16mg  daily today  Date of Induction: 05/17/17  Current follow up interval, in weeks: 2-4  The patient has been NOT adherent with the buprenorphine for OUD contract.   Last UDS Result: Polysubstances present  HPI: 52 year old male here for follow-up of opioid use disorder.  His suboxone pharmacy assistance program has ended last month.  He reports that he tried to conserve medication and takes medication only twice a day.  However he has since run out over 2 weeks ago and he has again relapsed.  He reports that his last illicit drug use was heroin intranasally last night.  He reports he is currently has a headache, diarrhea, nausea, and generalized malaise.  He reports that he is currently feeling withdrawal symptoms.  He continues to score high on GAD and PHQ 9 and feels that anxiety and depression are a problem he has not been taking Prozac.  He has been taking his blood pressure and diabetes medications.  Exam:   Vitals:   08/14/18 0918  BP: (!) 153/84  Pulse: 77  Temp: 98.1 F (36.7 C)  TempSrc: Oral  SpO2: 98%  Weight: 173 lb 4.8 oz (78.6 kg)  Height: 5\' 7"  (1.702 m)    General: poor eye contact CV: Regular rate and rhythm with no murmurs Lungs: CTAB Skin: No rashes Ext: Warm and well-perfused with no edema Psych: depressed affect  Assessment/Plan:  See Problem Based Charting in the Encounters Tab     Gust RungHoffman, Swayzee Wadley C, DO  08/14/2018  9:57 AM

## 2018-08-14 NOTE — Assessment & Plan Note (Signed)
Restart Prozac. Discussied meeting psychologist in office, due to withdrawal symptoms he would like to defer till next visit.

## 2018-08-18 LAB — TOXASSURE SELECT,+ANTIDEPR,UR

## 2018-08-28 ENCOUNTER — Other Ambulatory Visit: Payer: Self-pay

## 2018-08-28 ENCOUNTER — Ambulatory Visit (INDEPENDENT_AMBULATORY_CARE_PROVIDER_SITE_OTHER): Payer: Self-pay | Admitting: Internal Medicine

## 2018-08-28 VITALS — BP 146/89 | HR 73 | Temp 98.2°F | Ht 67.0 in | Wt 172.5 lb

## 2018-08-28 DIAGNOSIS — E1165 Type 2 diabetes mellitus with hyperglycemia: Secondary | ICD-10-CM

## 2018-08-28 DIAGNOSIS — Z79899 Other long term (current) drug therapy: Secondary | ICD-10-CM

## 2018-08-28 DIAGNOSIS — F411 Generalized anxiety disorder: Secondary | ICD-10-CM

## 2018-08-28 DIAGNOSIS — F329 Major depressive disorder, single episode, unspecified: Secondary | ICD-10-CM

## 2018-08-28 DIAGNOSIS — F1120 Opioid dependence, uncomplicated: Secondary | ICD-10-CM

## 2018-08-28 DIAGNOSIS — I10 Essential (primary) hypertension: Secondary | ICD-10-CM

## 2018-08-28 MED ORDER — BUPRENORPHINE HCL-NALOXONE HCL 8-2 MG SL SUBL: 1.0000 | SUBLINGUAL_TABLET | Freq: Two times a day (BID) | SUBLINGUAL | 0 refills | Status: DC

## 2018-08-28 MED ORDER — BUPRENORPHINE HCL-NALOXONE HCL 8-2 MG SL SUBL: 1.0000 | SUBLINGUAL_TABLET | Freq: Two times a day (BID) | SUBLINGUAL | 0 refills | Status: AC

## 2018-08-28 MED FILL — BUPRENORPHIN-NALOXON 8-2 MG: 8-2 | 14 days supply | Qty: 28 | Fill #0

## 2018-08-28 NOTE — Assessment & Plan Note (Signed)
Appears to be doing very well on 60 mg total dose of Suboxone.  I will continue this I will have him follow-up in 4 weeks.

## 2018-08-28 NOTE — Assessment & Plan Note (Signed)
CAD 7 had decreased to 2 overall looks like he is doing well however he has not started fluoxetine I discussed that we had prescribed the capsule form please let me know if it is the tablet form that is cheaper for him to pick up.

## 2018-08-28 NOTE — Assessment & Plan Note (Signed)
Discussed healthy eating around the holidays will be due for A1c at next visit.

## 2018-08-28 NOTE — Assessment & Plan Note (Signed)
Mildly elevated but in the setting of missed dose this is reasonable to defer till next visit if remains elevated we will need to escalate the dose of lisinopril.

## 2018-08-28 NOTE — Progress Notes (Signed)
   08/28/2018  Omar Castillo presents for follow up of opioid use disorder I have reviewed the prior induction visit, follow up visits, and telephone encounters relevant to opiate use disorder (OUD) treatment.   Current daily dose: Suboxone 24 mg daily>>> decrease to 16mg  daily today  Date of Induction: 05/17/17  Current follow up interval, in weeks: 2-4  The patient has been NOT adherent with the buprenorphine for OUD contract.   Last UDS Result: Polysubstances present  HPI: [id number] year old male here for follow-up of opioid use disorder.  He was late to his appointment today he did not know the exact time of his scheduled appointment.  However he looks well he reports that he is doing very well after resuming Suboxone.  He notes that 2 tablets a day is controlling his cravings and he has not had any substance abuse in the interim 2 weeks.  He notes that his anxiety and depression are also much better controlled however he has not started the fluoxetine due to an issue with the pharmacy.  He reports to me that the tablets were filled instead of the capsules and the cost would be $40 instead of $4.  Nonetheless he is hopeful for his future and very appreciative of his care.  His blood pressure is mildly elevated today he reports he was rushed to get here he did not take his blood pressure medication and that is unusual he has been adherent to his diabetic medications and denies any polyuria polydipsia.  Exam:   Vitals:   08/28/18 1403  BP: (!) 146/89  Pulse: 73  Temp: 98.2 F (36.8 C)  TempSrc: Oral  SpO2: 98%  Weight: 172 lb 8 oz (78.2 kg)  Height: 5\' 7"  (1.702 m)    General: good eye contact, upbeat CV: Regular rate and rhythm with no murmurs Lungs: CTAB Skin: No rashes Ext: Warm and well-perfused with no edema Psych: normal affect  Assessment/Plan:  See Problem Based Charting in the Encounters Tab     Gust RungHoffman, Ashtin Rosner C, DO  08/28/2018  2:16 PM

## 2018-09-10 MED FILL — BUPRENORPHIN-NALOXON 8-2 MG: 8-2 | 14 days supply | Qty: 28 | Fill #0

## 2018-09-25 ENCOUNTER — Ambulatory Visit (INDEPENDENT_AMBULATORY_CARE_PROVIDER_SITE_OTHER): Payer: Self-pay | Admitting: Internal Medicine

## 2018-09-25 VITALS — BP 131/60 | HR 73 | Temp 98.4°F | Wt 176.7 lb

## 2018-09-25 DIAGNOSIS — Z79899 Other long term (current) drug therapy: Secondary | ICD-10-CM

## 2018-09-25 DIAGNOSIS — F411 Generalized anxiety disorder: Secondary | ICD-10-CM

## 2018-09-25 DIAGNOSIS — F1120 Opioid dependence, uncomplicated: Secondary | ICD-10-CM

## 2018-09-25 MED ORDER — BUPRENORPHINE HCL-NALOXONE HCL 8-2 MG SL SUBL: 1.0000 | SUBLINGUAL_TABLET | Freq: Two times a day (BID) | SUBLINGUAL | 0 refills | Status: DC

## 2018-09-25 NOTE — Assessment & Plan Note (Signed)
His cravings and withdrawal symptoms are currently stable on 16mg  daily of suboxone.  However, he notes that he thinks that he is craving the act of using, including the snorting.  He does go to meetings and has heard from others at NA that the act of using is a big trigger for them.  Given this finding, I think he would greatly benefit from psychiatry or at least counseling.   Plan Will have him meet our counselor, Phineas Semen at next visit Continue buprenorphine 16mg  daily  NCRS reviewed and appropriate UDS today Return in 2 weeks.

## 2018-09-25 NOTE — Assessment & Plan Note (Signed)
Patient is on buspar and Prozac.  He continues to have uncontrolled anxiety.  He is taking Xanax over the counter.  I discussed that he will likely need to see a psychiatrist and discuss anxiolytics with that provider.  He continues to use short acting narcotics after his recent relapse and I think counseling and psychiatry will be of good use to him.  I would like him to see Omar Castillo at next visit here in our clinic as well.   Plan Continue Buspar and fluoxetine Would recommend he go to [facility] in the future, will ensure he see Omar Castillo at next visit.

## 2018-09-25 NOTE — Patient Instructions (Signed)
Omar Castillo --   Please come back to see [place] in 2 weeks.   Please continue to take your buprenorphine twice a day.   I think you may need to see psychiatry in the future for anxiety, we will discuss that next visit.   Thank you!

## 2018-09-25 NOTE — Progress Notes (Signed)
   09/25/2018  Omar Castillo presents for follow up of opioid use disorder I have reviewed the prior induction visit, follow up visits, and telephone encounters relevant to opiate use disorder (OUD) treatment.   Current daily dose: Buprenorphine 16mg  daily  Date of Induction: 05/17/17 (prolonged relapse from 06/2018 - 08/2018)  Current follow up interval, in weeks: 2 weeks  The patient has not been adherent with the buprenorphine for OUD contract.   Last UDS Result: + polysubstance  HPI: Omar Castillo is a 53 year old man with opioid use disorder who presents for treatment.  Omar Castillo has been a patient with [place] for over one year, but recently had a prolonged relapse due to stress and the death of his mother and sister.  He is back with [place] for treatment with buprenorphine.  He has concomitant anxiety which has been difficult to treat.  I think he would benefit from counseling.  He reports one time use of oxycodone, snorted, since last visit.  He knows that he should get completely clean, but is having difficulty.  He is going to meetings and attempting to stay away from previous influences.   Exam:   Vitals:   09/25/18 0932  BP: 131/60  Pulse: 73  Temp: 98.4 F (36.9 C)  TempSrc: Oral  SpO2: 99%  Weight: 176 lb 11.2 oz (80.2 kg)    Physical Exam:  General: Awake, alert, no distress Eyes: Anicteric sclerae, no injection CV: RR, NR, no murmur Pulm: CTAB, no wheezing Psych: Tearful, somewhat depressed mood.   Assessment/Plan:  See Problem Based Charting in the Encounters Tab     [name], MD  09/25/2018  9:43 AM

## 2018-09-27 MED FILL — BUPRENORPHIN-NALOXON 8-2 MG: 8-2 | 14 days supply | Qty: 28 | Fill #0

## 2018-10-02 LAB — TOXASSURE SELECT,+ANTIDEPR,UR

## 2018-10-09 ENCOUNTER — Encounter: Payer: Self-pay | Admitting: Licensed Clinical Social Worker

## 2018-10-09 ENCOUNTER — Ambulatory Visit (INDEPENDENT_AMBULATORY_CARE_PROVIDER_SITE_OTHER): Payer: Self-pay | Admitting: Licensed Clinical Social Worker

## 2018-10-09 ENCOUNTER — Ambulatory Visit (INDEPENDENT_AMBULATORY_CARE_PROVIDER_SITE_OTHER): Payer: Self-pay | Admitting: Student in an Organized Health Care Education/Training Program

## 2018-10-09 VITALS — BP 149/85 | HR 79 | Wt 174.9 lb

## 2018-10-09 DIAGNOSIS — E1165 Type 2 diabetes mellitus with hyperglycemia: Secondary | ICD-10-CM

## 2018-10-09 DIAGNOSIS — F1120 Opioid dependence, uncomplicated: Secondary | ICD-10-CM

## 2018-10-09 DIAGNOSIS — E118 Type 2 diabetes mellitus with unspecified complications: Secondary | ICD-10-CM

## 2018-10-09 DIAGNOSIS — E119 Type 2 diabetes mellitus without complications: Secondary | ICD-10-CM

## 2018-10-09 DIAGNOSIS — Z7984 Long term (current) use of oral hypoglycemic drugs: Secondary | ICD-10-CM

## 2018-10-09 LAB — POCT GLYCOSYLATED HEMOGLOBIN (HGB A1C): Hemoglobin A1C: 8.5 % — AB (ref 4.0–5.6)

## 2018-10-09 LAB — GLUCOSE, CAPILLARY: Glucose-Capillary: 365 mg/dL — ABNORMAL HIGH (ref 70–99)

## 2018-10-09 MED ORDER — METFORMIN HCL 1000 MG PO TABS: 1000.0000 mg | ORAL_TABLET | Freq: Two times a day (BID) | ORAL | 3 refills | Status: AC

## 2018-10-09 MED ORDER — GLIPIZIDE 5 MG PO TABS: 5.0000 mg | ORAL_TABLET | Freq: Two times a day (BID) | ORAL | 2 refills | Status: AC

## 2018-10-09 MED ORDER — BUPRENORPHINE HCL-NALOXONE HCL 8-2 MG SL SUBL: 1.0000 | SUBLINGUAL_TABLET | Freq: Two times a day (BID) | SUBLINGUAL | 0 refills | Status: DC

## 2018-10-09 MED FILL — BUPRENORPHIN-NALOXON 8-2 MG: 8-2 | 14 days supply | Qty: 28 | Fill #0

## 2018-10-09 NOTE — BH Specialist Note (Signed)
Integrated Behavioral Health Initial Visit  MRN: [id number] Name: Omar Castillo  Number of Integrated Behavioral Health Clinician visits:: 1/6 Session Start time: 8:45  Session End time: 8:55 Total time: 10 minutes  Type of Service: Integrated Behavioral Health- Individual/Family Interpretor:No.    Warm Hand Off Completed. Yes, by Dr. Oswaldo Done.       SUBJECTIVE: Omar Castillo is a 53 y.o. male  whom attended the session individually.  Patient was referred by Dr. Oswaldo Done for depression. Patient reports the following symptoms/concerns: remaining committed to his sobriety and grief.  Duration of problem: over one year; Severity of problem: mild  OBJECTIVE: Mood: Depressed and Affect: Depressed Risk of harm to self or others: No plan to harm self or others  LIFE CONTEXT: Family and Social: Patient lives with his aunt. Patient's mother and his sister passed away, and the patient is continuing to grieve their loss.  Self-Care: Patient watches television as his main coping skill.  Life Changes: Patient lost his mother and his sister over the past year.   GOALS ADDRESSED: Patient will: 1. Reduce symptoms of: depression and grief. 2. Increase knowledge and/or ability of: coping skills and healthy habits  3. Demonstrate ability to: Increase healthy adjustment to current life circumstances, Begin healthy grieving over loss and remain committed to the OUD program.  INTERVENTIONS: Interventions utilized: Motivational Interviewing and Supportive Counseling  Standardized Assessments completed: assessed for SI, HI, and self-harm.  ASSESSMENT: Patient currently experiencing "good days and bad days". Patient reported today he is having a "good day". Patient is continuing to grieve the loss of his mother and sister. Patient spends most of his time in his home watching television. Patient reported he is committed to the OUD program, and is working to challenge his cravings to use substances.     Patient may benefit from outpatient counseling.  PLAN: 1. Follow up with behavioral health clinician on : two weeks.  2. Referral(s): Integrated [facility] [facility] (In Clinic)   [place], [place], [place]

## 2018-10-09 NOTE — Progress Notes (Signed)
   10/09/2018  Omar Castillo presents for follow up of opioid use disorder I have reviewed the prior induction visit, follow up visits, and telephone encounters relevant to opiate use disorder (OUD) treatment.   Current daily dose: Suboxone 16 mg daily  Date of Induction: 05/17/17  Current follow up interval, in weeks: 2  The patient has been adherent with the buprenorphine for OUD contract.   Last UDS Result: Bupe and metabolite present, polysubstances also present  HPI: 53 year old man here for follow-up of opioid use disorder.  Last seen 2 weeks ago, still having intermittent relapses.  Used heroin Friday.  Snorted.  Says it was impulsive.  Does not know why he still does it.  Cravings are well controlled on the Suboxone.  He is never injector.  Still struggling with grief after the death of his mother.  Lives with his aunt, not currently working.  Long hours of the day are currently unfilled, though he is interested in getting back into work.  Still approached by dealers frequently and struggling to get away from old acquaintances to reduce contact with drugs.  Goes to Narcotics Anonymous meetings about every other night.  We talked about his diabetes today.  Last A1c was 11%.  A little better today at 8.5%.  Ran out of metformin a few days ago.  Says these medicines are affordable through [facility].  Otherwise reports good compliance with them.  Still drinks several Mountain Dew's per day.  Reports polyuria and polydipsia currently.  Exam:   Vitals:   10/09/18 0828  BP: (!) 149/85  Pulse: 79  Weight: 174 lb 14.4 oz (79.3 kg)    General: Well-appearing man ENT: Moist mucous membranes Heart: Regular rate and rhythm with no murmurs Lungs: Unlabored, clear throughout Extremities: Warm and well-perfused with no lower extremity edema   Assessment/Plan:  See Problem Based Charting in the Encounters Tab    [name], MD  10/09/2018  8:55 AM

## 2018-10-09 NOTE — Assessment & Plan Note (Signed)
Slight improvement, A1c down to 8.5%, but still not at goal.  Still struggling with some compliance.  I sent refills of metformin 1000 mg twice daily and glipizide 5 mg twice daily to his pharmacy.  90-day supplies.  Patient is self-pay, this cost him about $10 per prescription.  Repeat A1c in 3 months.  Talked about reducing sugary drinks.

## 2018-10-09 NOTE — Patient Instructions (Signed)
We talked about your diabetes today.  I sent a 37-monthsupply of metformin and glipizide to [facility].  Please take these medications every day.  We talked about reducing the amount of [facility] each day.  Keep up your hydration with water.  Continue taking your Suboxone.  Keep going to meetings.  Today you met with our counselor briefly, I would like you to arrange for a more in-depth session in 2 weeks at our next visit.

## 2018-10-09 NOTE — Assessment & Plan Note (Signed)
Stable.  Not yet controlled.  Still with relapses at least once per week.  Cravings well controlled.  I think we are at least achieving risk reduction.  Plan to continue with Suboxone 16 mg in divided doses.  I sent a 2-week supply to the [facility] outpatient pharmacy.  Follow-up with [place] in 2 weeks.  I reviewed the database which was appropriate.  Urine substance test today.

## 2018-10-13 LAB — TOXASSURE SELECT,+ANTIDEPR,UR

## 2018-10-23 ENCOUNTER — Ambulatory Visit (INDEPENDENT_AMBULATORY_CARE_PROVIDER_SITE_OTHER): Payer: Self-pay | Admitting: Internal Medicine

## 2018-10-23 VITALS — BP 151/84 | HR 78 | Temp 98.5°F | Wt 174.4 lb

## 2018-10-23 DIAGNOSIS — F1120 Opioid dependence, uncomplicated: Secondary | ICD-10-CM

## 2018-10-23 MED ORDER — BUPRENORPHINE HCL-NALOXONE HCL 8-2 MG SL SUBL: 1.0000 | SUBLINGUAL_TABLET | Freq: Two times a day (BID) | SUBLINGUAL | 0 refills | Status: DC

## 2018-10-23 MED FILL — BUPRENORPHIN-NALOXON 8-2 MG: 8-2 | 14 days supply | Qty: 28 | Fill #0

## 2018-10-23 NOTE — Assessment & Plan Note (Signed)
Patient continues to have intermittent relapses. Cravings are well controlled. I think he has improved his overall craving control. He appears to continue to be a good candidate for suboxone therapy. Will repeat Utox today and refill for 16mg  daily suboxone.

## 2018-10-23 NOTE — Progress Notes (Signed)
Internal Medicine Clinic Attending  I saw and evaluated the patient.  I personally confirmed the key portions of the history and exam documented by Dr. Harbrecht and I reviewed pertinent patient test results.  The assessment, diagnosis, and plan were formulated together and I agree with the documentation in the resident's note.  

## 2018-10-23 NOTE — Patient Instructions (Addendum)
Please stop and schedule at the front desk with Dr. Alinda Money for a routine visit at his next available appointment as well as with the OUD clinic in two weeks.

## 2018-10-23 NOTE — Progress Notes (Signed)
   10/23/2018  Omar Castillo presents for follow up of opioid use disorder I have reviewed the prior induction visit, follow up visits, and telephone encounters relevant to opiate use disorder (OUD) treatment.   Current daily dose: Suboxone 16mg  daily  Date of Induction: 05/27/2017  Current follow up interval, in weeks: 2  The patient has been adherent with the buprenorphine for OUD contract.   Last UDS Result: Atypical with positive fentanyl and alprazolam, will repeat utox today.  HPI: [name] is a 53 year old male presents today for chronic opioid use disorder.  There is less than 2 weeks prior also having intermittent relapses at that time.  He last used heroin before which he snorted.  He denied cravings over the last 2 weeks or use of other substances.  He has been experiencing flulike symptoms with myalgias, fever, chills, decreased p.o. intake, vomiting, abdominal pain and diarrhea.  He is sniffily improved as of today and is experiencing only a mild cough.   Exam:   Vitals:   10/23/18 0856  BP: (!) 151/84  Pulse: 78  Temp: 98.5 F (36.9 C)  TempSrc: Oral  SpO2: 99%  Weight: 174 lb 6.4 oz (79.1 kg)   General: A/O x4, in no acute distress, afebrile, nondiaphoretic Cardio: RRR, no mrg's Pulmonary: CTA bilaterally MSK: BLE nontender, nonedematous Neuro: Alert, conversational, strength 5/5 in the upper and lower extremities bilaterally, normal gait Psych: Appropriate affect, not depressed in appearance, engages well  Assessment/Plan:  See Problem Based Charting in the Encounters Tab   Lanelle Bal, MD  10/23/2018  9:13 AM

## 2018-10-30 LAB — TOXASSURE SELECT,+ANTIDEPR,UR

## 2018-11-06 ENCOUNTER — Other Ambulatory Visit: Payer: Self-pay | Admitting: Internal Medicine

## 2018-11-06 DIAGNOSIS — F1120 Opioid dependence, uncomplicated: Secondary | ICD-10-CM

## 2018-11-06 MED ORDER — BUPRENORPHINE HCL-NALOXONE HCL 8-2 MG SL SUBL: 1.0000 | SUBLINGUAL_TABLET | Freq: Two times a day (BID) | SUBLINGUAL | 0 refills | Status: DC

## 2018-11-06 MED FILL — BUPRENORPHIN-NALOXON 8-2 MG: 8-2 | 7 days supply | Qty: 14 | Fill #0

## 2018-11-06 NOTE — Progress Notes (Signed)
Mr. Yeager called and reported that his daughter had been in an accident and he is currently at [facility] with her.  Requesting a 1 week supply of medication.  Reschedule for next week.   Will refill X 1.  If he does not keep next appointment will need further information and possibly start wean of medication.   Debe Coder, MD

## 2018-11-06 NOTE — Progress Notes (Deleted)
   11/06/2018  Omar Castillo presents for follow up of opioid use disorder I have reviewed the prior induction visit, follow up visits, and telephone encounters relevant to opiate use disorder (OUD) treatment.   Current daily dose: ***  Date of Induction: ***  Current follow up interval, in weeks: ***  The patient {Has/has not:18111} been adherent with the buprenorphine for OUD contract.   Last UDS Result: ***  HPI: ***  Talk to [name]  Exam:   There were no vitals filed for this visit.  ***  Assessment/Plan:  See Problem Based Charting in the Encounters Tab     Lanelle Bal, MD  11/06/2018  9:00 AM

## 2018-11-13 ENCOUNTER — Other Ambulatory Visit: Payer: Self-pay

## 2018-11-13 ENCOUNTER — Ambulatory Visit (INDEPENDENT_AMBULATORY_CARE_PROVIDER_SITE_OTHER): Payer: Self-pay | Admitting: Internal Medicine

## 2018-11-13 VITALS — BP 144/90 | HR 88 | Temp 98.8°F | Wt 173.2 lb

## 2018-11-13 DIAGNOSIS — Z6379 Other stressful life events affecting family and household: Secondary | ICD-10-CM

## 2018-11-13 DIAGNOSIS — F1120 Opioid dependence, uncomplicated: Secondary | ICD-10-CM

## 2018-11-13 DIAGNOSIS — E119 Type 2 diabetes mellitus without complications: Secondary | ICD-10-CM

## 2018-11-13 DIAGNOSIS — Z79899 Other long term (current) drug therapy: Secondary | ICD-10-CM

## 2018-11-13 DIAGNOSIS — I10 Essential (primary) hypertension: Secondary | ICD-10-CM

## 2018-11-13 MED ORDER — LISINOPRIL 40 MG PO TABS: 40.0000 mg | ORAL_TABLET | Freq: Every day | ORAL | 3 refills | Status: AC

## 2018-11-13 MED ORDER — BUPRENORPHINE HCL-NALOXONE HCL 8-2 MG SL SUBL: 1.0000 | SUBLINGUAL_TABLET | Freq: Two times a day (BID) | SUBLINGUAL | 0 refills | Status: DC

## 2018-11-13 MED FILL — BUPRENORPHIN-NALOXON 8-2 MG: 8-2 | 14 days supply | Qty: 28 | Fill #0

## 2018-11-13 NOTE — Patient Instructions (Signed)
I want you to increase your lisinopril to 40mg  a day (take 2 of the 20mg  tablets until you run out then pick up the new Rx at [facility])

## 2018-11-13 NOTE — Progress Notes (Signed)
   11/13/2018  Omar Castillo presents for follow up of opioid use disorder I have reviewed the prior induction visit, follow up visits, and telephone encounters relevant to opiate use disorder (OUD) treatment.   Current daily dose: Suboxone 16 mg daily  Date of Induction: 05/17/17  Current follow up interval, in weeks: 2  The patient has been adherent with the buprenorphine for OUD contract.   Last UDS Result: Bupe and metabolite present, polysubstances also present  HPI: 53 year old man here for follow-up of opioid use disorder.  Last seen 3 weeks ago, still having intermittent relapses.  Missed appointment last week as his daughter had an accident and he was with her at the hospital.  His last UDS 3 weeks ago had buprenorphine present but also polysubstances.  His daughter remains in the hospital she has had to have multiple surgeries.  He does admit that this has caused him additional life stresses.  He is living in his mother's old house with his aunt.  However he notes that friends do stop by and provide him with heroin occasionally.  He notes his last relapse was Saturday.  He admits that his cravings are overall well controlled buprenorphine but that he has intermittent lapses in judgment when heroin is provided.  His last use was intranasally. He has been adherent with his diabetes medications and blood pressure medications although he reports he did not yet take his medications this morning as he has not yet eaten.  Overall he denies any polyuria or polydipsia.  No lightheadedness headaches blurry vision.  Exam:   There were no vitals filed for this visit.  General: Well-appearing man, less anxious appearing than usual ENT: Moist mucous membranes Heart: Regular rate and rhythm with no murmurs Lungs: CTAB Extremities: Warm and well-perfused with no lower extremity edema   Assessment/Plan:  See Problem Based Charting in the Encounters Tab    Omar Rung, DO  11/13/2018  8:37  AM

## 2018-11-13 NOTE — Addendum Note (Signed)
Addended by: Gust Rung on: 11/13/2018 09:57 AM   Modules accepted: Orders

## 2018-11-13 NOTE — Assessment & Plan Note (Signed)
Continues to have some intermittent relapses secondary to life stressors as well as easy availability.  He is taking the buprenorphine and he overall seems to be doing well.  We will continue to prescribe 16 mg of Suboxone daily in divided doses.  Close follow-up continue 2-week intervals.

## 2018-11-13 NOTE — Assessment & Plan Note (Signed)
Blood pressure remains elevated reports he did not take his lisinopril yet this morning.  However overall trend is mild elevation we will go ahead and increase his lisinopril to 40 mg daily.  We will have him take 2 tablets of 20 until his next refill.

## 2018-11-17 LAB — TOXASSURE SELECT,+ANTIDEPR,UR

## 2018-11-26 ENCOUNTER — Telehealth: Payer: Self-pay | Admitting: *Deleted

## 2018-11-26 ENCOUNTER — Other Ambulatory Visit: Payer: Self-pay | Admitting: Internal Medicine

## 2018-11-26 DIAGNOSIS — F1120 Opioid dependence, uncomplicated: Secondary | ICD-10-CM

## 2018-11-26 MED ORDER — BUPRENORPHINE HCL-NALOXONE HCL 8-2 MG SL SUBL: 1.0000 | SUBLINGUAL_TABLET | Freq: Two times a day (BID) | SUBLINGUAL | 1 refills | Status: DC

## 2018-11-26 MED FILL — BUPRENORPHIN-NALOXON 8-2 MG: 8-2 | 14 days supply | Qty: 28 | Fill #0

## 2018-11-26 NOTE — Telephone Encounter (Signed)
Addressed, medication refilled.

## 2018-11-26 NOTE — Telephone Encounter (Signed)
Patient reports doing well on suboxone, cravings controlled. Has mostly been at hopsital with his daughter for her recovery after MVA.  I provided 4 week refill, he will call us if any trouble and I asked him to call back in about 3 weeks to discuss next follow up appointment.

## 2018-11-26 NOTE — Telephone Encounter (Signed)
Pt would like dr Mikey Bussing to call him, he is at [facility] w/ his daughter but will answer as phone is working

## 2018-11-26 NOTE — Telephone Encounter (Signed)
Pt is requesting refill on buprenorphine-naloxone (SUBOXONE) 8-2 mg SUBL SL tablet [facility] - [place], [place] - [address] [facility].  ;pt contact

## 2018-12-10 ENCOUNTER — Telehealth: Payer: Self-pay | Admitting: Student in an Organized Health Care Education/Training Program

## 2018-12-10 DIAGNOSIS — F1120 Opioid dependence, uncomplicated: Secondary | ICD-10-CM

## 2018-12-10 MED ORDER — BUPRENORPHINE HCL-NALOXONE HCL 8-2 MG SL SUBL: 1.0000 | SUBLINGUAL_TABLET | Freq: Two times a day (BID) | SUBLINGUAL | 1 refills | Status: DC

## 2018-12-10 MED FILL — BUPRENORPHIN-NALOXON 8-2 MG: 8-2 | 14 days supply | Qty: 28 | Fill #0

## 2018-12-10 NOTE — Telephone Encounter (Signed)
Spoke with the patient over the phone.  He is doing well with Suboxone 1 tablet twice daily.  Reports cravings are well controlled.  Last heroin use was 3 weeks ago.  Uses an occasional Xanax.  Mood is okay.  Feeling fairly isolated, but he is complying with stated home orders due to the COVID-19 restrictions.  No illnesses.  No sick contacts.  We talked about changing his pharmacy to the [facility] long outpatient pharmacy for now.  He understands.  I sent a 1 month supply.  Hopefully we can follow-up with him in person in 1 month.

## 2018-12-10 NOTE — Telephone Encounter (Signed)
Left a voice message to return call to me.  Trying to get in touch to do a telephone visit for his opioid use disorder today.

## 2018-12-21 MED FILL — BUPRENORPHIN-NALOXON 8-2 MG: 8-2 | 14 days supply | Qty: 28 | Fill #1

## 2019-01-03 ENCOUNTER — Other Ambulatory Visit: Payer: Self-pay | Admitting: *Deleted

## 2019-01-03 DIAGNOSIS — F1120 Opioid dependence, uncomplicated: Secondary | ICD-10-CM

## 2019-01-03 MED ORDER — BUPRENORPHINE HCL-NALOXONE HCL 8-2 MG SL SUBL: 1.0000 | SUBLINGUAL_TABLET | Freq: Two times a day (BID) | SUBLINGUAL | 1 refills | Status: AC

## 2019-01-03 MED FILL — BUPRENORPHIN-NALOXON 8-2 MG: 8-2 | 14 days supply | Qty: 28 | Fill #0

## 2019-01-03 NOTE — Telephone Encounter (Signed)
Omar Castillo called me back he reports he is doing very well with the shelter in place order has actually been a good thing for his sobriety.  He reports that his been 4 weeks since his last opioid relapse.  He is feeling good about himself, getting a little stir crazy but otherwise doing well.  I will continue him on his Suboxone therapy 8 mg twice daily I will provide a 1 month fill at the [facility] outpatient pharmacy.

## 2019-01-03 NOTE — Telephone Encounter (Signed)
Tried calling no answer, left message to call back at my desk number.

## 2019-01-03 NOTE — Telephone Encounter (Signed)
Pt calls and states he has 1 more day of med left. He will be expecting a call from one of the OUD doctors

## 2019-01-17 MED FILL — BUPRENORPHIN-NALOXON 8-2 MG: 8-2 | 14 days supply | Qty: 28 | Fill #1

## 2019-01-31 ENCOUNTER — Telehealth: Payer: Self-pay | Admitting: Internal Medicine

## 2019-01-31 NOTE — Telephone Encounter (Signed)
Refill Request   buprenorphine-naloxone (SUBOXONE) 8-2 mg SUBL SL tablet   [facility] - [place], [place] - [address]

## 2019-02-01 MED ORDER — BUPRENORPHINE HCL-NALOXONE HCL 8-2 MG SL SUBL: 1.0000 | SUBLINGUAL_TABLET | Freq: Two times a day (BID) | SUBLINGUAL | 0 refills | Status: DC

## 2019-02-01 MED FILL — BUPRENORPHIN-NALOXON 8-2 MG: 8-2 | 14 days supply | Qty: 28 | Fill #0

## 2019-02-01 NOTE — Telephone Encounter (Signed)
I tried to call, no answer, no VM. I will refill a 2 week supply of bupe. Lets try to get him in for a in person visit in June. Thanks.

## 2019-02-06 NOTE — Telephone Encounter (Signed)
Yes, June 23 is ok. He will just need another tele refill in 2 weeks which is fine.

## 2019-02-06 NOTE — Telephone Encounter (Signed)
Is June 23rd ok?

## 2019-02-06 NOTE — Telephone Encounter (Signed)
OUD clinic has no openings until June 23.  Forwarding back to triage nurse to make them aware.

## 2019-02-14 ENCOUNTER — Other Ambulatory Visit: Payer: Self-pay | Admitting: Internal Medicine

## 2019-02-14 MED ORDER — BUPRENORPHINE HCL-NALOXONE HCL 8-2 MG SL SUBL: 1.0000 | SUBLINGUAL_TABLET | Freq: Two times a day (BID) | SUBLINGUAL | 0 refills | Status: DC

## 2019-02-14 NOTE — Telephone Encounter (Signed)
I reviewed the database which was fine. He has an OUD appt set for 6/23. I sent a 3 week refill to [facility].

## 2019-02-14 NOTE — Telephone Encounter (Signed)
Refill Request ° ° °buprenorphine-naloxone (SUBOXONE) 8-2 mg SUBL SL tablet ° ° °[facility] - [place], [place] - [address] °

## 2019-02-16 MED FILL — BUPRENORPHIN-NALOXON 8-2 MG: 8-2 | 14 days supply | Qty: 28 | Fill #0

## 2019-03-01 MED FILL — BUPRENORPHIN-NALOXON 8-2 MG: 8-2 | 7 days supply | Qty: 14 | Fill #0

## 2019-03-05 ENCOUNTER — Other Ambulatory Visit: Payer: Self-pay

## 2019-03-05 ENCOUNTER — Ambulatory Visit (INDEPENDENT_AMBULATORY_CARE_PROVIDER_SITE_OTHER): Payer: Self-pay | Admitting: Internal Medicine

## 2019-03-05 VITALS — BP 129/85 | HR 101 | Temp 98.9°F | Wt 156.4 lb

## 2019-03-05 DIAGNOSIS — Z6824 Body mass index (BMI) 24.0-24.9, adult: Secondary | ICD-10-CM

## 2019-03-05 DIAGNOSIS — E1169 Type 2 diabetes mellitus with other specified complication: Secondary | ICD-10-CM

## 2019-03-05 DIAGNOSIS — Z7984 Long term (current) use of oral hypoglycemic drugs: Secondary | ICD-10-CM

## 2019-03-05 DIAGNOSIS — E1165 Type 2 diabetes mellitus with hyperglycemia: Secondary | ICD-10-CM

## 2019-03-05 DIAGNOSIS — Z720 Tobacco use: Secondary | ICD-10-CM

## 2019-03-05 DIAGNOSIS — R05 Cough: Secondary | ICD-10-CM

## 2019-03-05 DIAGNOSIS — F1120 Opioid dependence, uncomplicated: Secondary | ICD-10-CM

## 2019-03-05 LAB — POCT GLYCOSYLATED HEMOGLOBIN (HGB A1C): HbA1c POC (<> result, manual entry): >14 % — AB (ref 4.0–5.6)

## 2019-03-05 LAB — GLUCOSE, CAPILLARY: Glucose-Capillary: 513 mg/dL (ref 70–99)

## 2019-03-05 MED ORDER — LANTUS SOLOSTAR 100 UNIT/ML ~~LOC~~ SOPN: 14.0000 [IU] | PEN_INJECTOR | Freq: Every day | SUBCUTANEOUS | Status: AC

## 2019-03-05 MED ORDER — BUPRENORPHINE HCL-NALOXONE HCL 8-2 MG SL SUBL: 1.0000 | SUBLINGUAL_TABLET | Freq: Two times a day (BID) | SUBLINGUAL | 1 refills | Status: DC

## 2019-03-05 NOTE — Assessment & Plan Note (Signed)
Discussed need for smoking cessation.  He does have a chronic cough if cough is not improved we may need to consider obtaining a chest x-ray.

## 2019-03-05 NOTE — Assessment & Plan Note (Signed)
HPI: He reports to me that he has been taking his metformin but ran out of glipizide had not yet been refilled.  He has been eating a little bit more and healthy and occasionally drinking [facility].  He has had urinary frequency about once per hour and increased fluid intake as well as a dry mouth.  He is lost about 17 pounds since we have last saw him.  Assessment weight loss due to uncontrolled type 2 diabetes  Plan Discussed with him that he is insulin deficient and this is the cause for his weight loss.  I will have him continue metformin and resume his glipizide however he will need some insulin therapy.  He reports that he has unexpired Lantus at home from his mother I told him this is not ideal but he may use that instead of purchasing new insulin as long as it is on expired.  I will have him start 14 units daily with close follow-up with his PCP.  I want him to check his blood sugars ideally 3 times a day but at least 2 times a day and bring his meter. I do suspect his uncontrolled diabetes and glucosuria are the reason for his weight loss I discussed that we could pursue a more thorough work-up however once we explained and talked about the diabetes he is willing to start insulin first.

## 2019-03-05 NOTE — Patient Instructions (Signed)
I want you to start taking 14 units of Lantus every day.  Please check your sugars at least twice a day and bring your meter to your next visit.

## 2019-03-05 NOTE — Progress Notes (Signed)
140

## 2019-03-05 NOTE — Progress Notes (Signed)
   03/05/2019  Omar Castillo presents for follow up of opioid use disorder I have reviewed the prior induction visit, follow up visits, and telephone encounters relevant to opiate use disorder (OUD) treatment.   Current daily dose: Suboxone 16mg  daily   Date of Induction: 05/17/17  Current follow up interval, in weeks: 2-4  The patient has been adherent with the buprenorphine for OUD contract.   Last UDS Result: Polysubstances present, buprenorphrine present  HPI: 53 year old male here for follow-up of opioid use disorder.  Patient was on time to his appointment.  However his main concern today is weight loss.  I will address this further in his assessment and plan. As far as his opioid use disorder.  He reports that he has been adherent to Suboxone therapy.  However he reports during the coronavirus pandemic he has had 2 relapses most recently this past Sunday.  He reports both times it was someone he knows who visited his house to check on him and then offered him heroin.  He reports he has told the individual not to come back and his aunt who he lives with is also threatened that the friend provided drugs is no longer welcome.  He does not know why he used he reports that his cravings have been well controlled but it was just temptation from an old life.  Additionally he does report a chronic cough for about 2 months he reports occasionally coughing up some phlegm.  He denies any chills sore throat fever or other complaints.  He has lost about 17 pounds.  Exam:   Vitals:   03/05/19 0927  BP: 129/85  Pulse: (!) 101  Temp: 98.9 F (37.2 C)  TempSrc: Oral  SpO2: 96%  Weight: 156 lb 6.4 oz (70.9 kg)    General: good eye contact, upbeat, thin CV: Regular rate and rhythm with no murmurs Lungs: CTAB Skin: No rashes Ext: Warm and well-perfused with no edema Psych: normal affect  Assessment/Plan:  See Problem Based Charting in the Encounters Tab     Lucious Groves, DO   03/05/2019  10:40 AM

## 2019-03-05 NOTE — Assessment & Plan Note (Addendum)
Omar Castillo appears to be doing okay with Suboxone therapy I do believe that he is adherent to therapy but he continues to have intermittent relapses it appears this is mostly situational.  And does appear to be decreasing in frequency.  I will have him seen by his PCP in 2 to 6 weeks for follow-up of his diabetes he could be provided another Suboxone prescription assuming he is doing well at that time and plan for him to be seen for a dedicated to OUD visit in 2 months.

## 2019-03-06 MED FILL — BUPRENORPHIN-NALOXON 8-2 MG: 8-2 | 14 days supply | Qty: 28 | Fill #0

## 2019-03-10 LAB — TOXASSURE SELECT,+ANTIDEPR,UR

## 2019-03-21 MED FILL — BUPRENORPHIN-NALOXON 8-2 MG: 8-2 | 14 days supply | Qty: 28 | Fill #1

## 2019-04-04 ENCOUNTER — Ambulatory Visit: Payer: Self-pay

## 2019-04-04 ENCOUNTER — Encounter: Payer: Self-pay | Admitting: Internal Medicine

## 2019-04-08 ENCOUNTER — Other Ambulatory Visit: Payer: Self-pay

## 2019-04-08 MED ORDER — BUPRENORPHINE HCL-NALOXONE HCL 8-2 MG SL SUBL: 1.0000 | SUBLINGUAL_TABLET | Freq: Two times a day (BID) | SUBLINGUAL | 1 refills | Status: DC

## 2019-04-08 MED FILL — BUPRENORPHIN-NALOXON 8-2 MG: 8-2 | 14 days supply | Qty: 28 | Fill #0

## 2019-04-08 NOTE — Telephone Encounter (Signed)
buprenorphine-naloxone (SUBOXONE) 8-2 mg SUBL SL tablet   REFILL REQUEST @  [facility] - [place], [place] - [address] [phone] (Phone) [phone] (Fax)    

## 2019-04-08 NOTE — Telephone Encounter (Signed)
Next appt scheduled 8/18 in OUD clinic.

## 2019-04-24 ENCOUNTER — Ambulatory Visit: Payer: Self-pay

## 2019-04-24 MED FILL — BUPRENORPHIN-NALOXON 8-2 MG: 8-2 | 14 days supply | Qty: 28 | Fill #1

## 2019-04-29 ENCOUNTER — Telehealth: Payer: Self-pay | Admitting: *Deleted

## 2019-04-29 NOTE — Telephone Encounter (Signed)
Yes, Telehealth please. He should not come to the clinic if he had that close of an exposure.

## 2019-04-29 NOTE — Telephone Encounter (Signed)
Pt calls and states his aunt who he lives with has tested positive for COVID. He would like to be advised if he should still come to appt tomorrow and where he can get tested. telehealth appt tomorrow? Please advise.

## 2019-04-29 NOTE — Telephone Encounter (Signed)
Spoke to [employer], she will add pt for telehealth visit in am for OUD. Called pt, left detailed message when vmail identified as pt as to answering teleheath visit call in am, ask him to call for questions

## 2019-04-30 ENCOUNTER — Encounter: Payer: Self-pay | Admitting: Internal Medicine

## 2019-04-30 ENCOUNTER — Ambulatory Visit (INDEPENDENT_AMBULATORY_CARE_PROVIDER_SITE_OTHER): Payer: Self-pay | Admitting: Internal Medicine

## 2019-04-30 ENCOUNTER — Other Ambulatory Visit: Payer: Self-pay

## 2019-04-30 DIAGNOSIS — F1120 Opioid dependence, uncomplicated: Secondary | ICD-10-CM

## 2019-04-30 DIAGNOSIS — E1165 Type 2 diabetes mellitus with hyperglycemia: Secondary | ICD-10-CM

## 2019-04-30 DIAGNOSIS — Z794 Long term (current) use of insulin: Secondary | ICD-10-CM

## 2019-04-30 DIAGNOSIS — E118 Type 2 diabetes mellitus with unspecified complications: Secondary | ICD-10-CM

## 2019-04-30 MED ORDER — BUPRENORPHINE HCL-NALOXONE HCL 8-2 MG SL SUBL: 1.0000 | SUBLINGUAL_TABLET | Freq: Two times a day (BID) | SUBLINGUAL | 1 refills | Status: DC

## 2019-04-30 NOTE — Assessment & Plan Note (Signed)
Was restarted on lantus, metformin and glipizide last visit after having been found to have hgb a1c >14. States feeling significantly improved after restarting diabetes management. Mentions resolution of polyuria, polyphagia and polydipsia. Glucometer readings reported to show fasting bg around 160-180 much improved from >600s he was seeing in the past. Denies any hypoglycemic symptoms  - F/u with PCP within 4 weeks - C/w Lantus 14 units, metforming 1000mg  BID, glipizdie 5mg  bid

## 2019-04-30 NOTE — Progress Notes (Signed)
  [facility] Telephone Encounter OUD Clinic Appointment  HPI:   This telephone encounter was created for [name] on 04/30/2019 for the following purpose/cc opioid use disorder.  Omar Castillo presents for follow up of opioid use disorder I have reviewed the prior induction visit, follow up visits, and telephone encounters relevant to opiate use disorder (OUD) treatment.  Current daily dose: Suboxone 16mg  daily  Date of Induction: 05/27/17  Current follow up interval, in weeks: 4 weeks  The patient has been adherent with the buprenorphine for OUD contract.  Last UDS Result: Buprenorphine + Fentanyl + Tramadol + Morphine + Alprazolam   OmarCastillo was evaluated via telephone. He states over the last 4 weeks he has been doing well. He mentions having no relapse episodes and mentions that his cravings are controlled. He mentions overall feeling significantly better after restarting his diabetic treatment. He mentions improved sleep as his polyuria has subsided and mentions glucometer readings in the 180-190s as opposed to >600 prior to restarting his insulin. He mentions improved mood as well. He denies any significant difficulty with his suboxone regimen but does mention continuing to take alprazolam for his mood. He mentions that his aunt had recently tested positive for COVID but he has been using masks without difficulty and has been regularly checking his temperature at home without recorded febrile episodes (highest temp at 99.73F)   Past Medical History:  Past Medical History:  Diagnosis Date  . Back pain, chronic   . Generalized anxiety disorder 07/25/2006  . Opioid use disorder, severe, on maintenance therapy ([place]) 05/23/2017  . 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 Cay Schillings   . Tobacco use 07/25/2006     ROS:  Review of Systems  Constitutional: Negative for chills and fever.   Respiratory: Negative for shortness of breath.   Cardiovascular: Negative for chest pain.  Gastrointestinal: Negative for constipation, diarrhea, nausea and vomiting.  Genitourinary: Negative for dysuria and frequency.  Neurological: Negative for dizziness.  Endo/Heme/Allergies: Negative for polydipsia.  All other systems reviewed and are negative.    Assessment / Plan / Recommendations:   Please see A&P under problem oriented charting for assessment of the patient's acute and chronic medical conditions.   As always, pt is advised that if symptoms worsen or new symptoms arise, they should go to an urgent care facility or to to ER for further evaluation.   Consent and Medical Decision Making:   Patient discussed with Dr. Daryll Drown  This is a telephone encounter between Omar Castillo on 04/30/2019 for OUD. The visit was conducted with the patient located at home and Omar Castillo at [facility]. The patient's identity was confirmed using their DOB and current address. The patient has consented to being evaluated through a telephone encounter and understands the associated risks (an examination cannot be done and the patient may need to come in for an appointment) / benefits (allows the patient to remain at home, decreasing exposure to coronavirus). I personally spent 17 minutes on medical discussion.

## 2019-04-30 NOTE — Assessment & Plan Note (Signed)
Omar Castillo states he has been doing well on suboxone therapy. Denies any significant cravings or relapse events. Mentions some stressors due to aunt who have recently been diagnosed with COVID-19 but states he is keeping himself safe and denies any febrile episodes. He mentions overall feeling well now that his diabetes is much better controlled and feeling comfortable. Denies any withdrawal symptoms.  - F/u in 4 weeks in person -  C/w suboxone 8mg  BID

## 2019-05-02 NOTE — Progress Notes (Signed)
Internal Medicine Clinic Attending  Case discussed with Dr. Truman Hayward at the time of the visit.  We reviewed the resident's history and telephone conversation and pertinent patient test results.  I agree with the assessment, diagnosis, and plan of care documented in the resident's note.

## 2019-05-09 MED FILL — BUPRENORPHIN-NALOXON 8-2 MG: 8-2 | 14 days supply | Qty: 28 | Fill #0

## 2019-05-15 ENCOUNTER — Ambulatory Visit: Payer: Self-pay

## 2019-05-16 ENCOUNTER — Other Ambulatory Visit: Payer: Self-pay

## 2019-05-16 ENCOUNTER — Encounter: Payer: Self-pay | Admitting: Internal Medicine

## 2019-05-16 ENCOUNTER — Ambulatory Visit (INDEPENDENT_AMBULATORY_CARE_PROVIDER_SITE_OTHER): Payer: Self-pay | Admitting: Internal Medicine

## 2019-05-16 DIAGNOSIS — Z794 Long term (current) use of insulin: Secondary | ICD-10-CM

## 2019-05-16 DIAGNOSIS — E1165 Type 2 diabetes mellitus with hyperglycemia: Secondary | ICD-10-CM

## 2019-05-16 DIAGNOSIS — E118 Type 2 diabetes mellitus with unspecified complications: Secondary | ICD-10-CM

## 2019-05-16 DIAGNOSIS — J069 Acute upper respiratory infection, unspecified: Secondary | ICD-10-CM | POA: Insufficient documentation

## 2019-05-16 NOTE — Progress Notes (Signed)
Internal Medicine Clinic Attending  Case discussed with Dr. Melvin  at the time of the visit.  We reviewed the resident's history and exam and pertinent patient test results.  I agree with the assessment, diagnosis, and plan of care documented in the resident's note.  

## 2019-05-16 NOTE — Assessment & Plan Note (Signed)
Omar Castillo reports 1 week on ongoing cough which has been intermittently productive of clear sputum for the past 1-2 days. He has no fevers, chills, nausea, or dyspnea. He is having 1-2 bowl movements per day and his prior baseline was 1 BM every other day; but he states this may be due to his Metformin which has caused him some increase in bowl movements. The BMs are not loose. He does has a relative who is in the hospital with COVID-19. He has been isolated at home, though not intentionally quarantining. He was advised to remain isolated/quarenteed for at least 1 more week and that he could consider a publicly offered Covid test, but at this time the test would not change his management.  He was advised to follow up for re-evaluation if symptoms fail to improve or worsen over the next week. - Continue to monitor - OTC cough/cold medication PRN

## 2019-05-16 NOTE — Progress Notes (Signed)
   This is a telephone encounter between Omar Castillo and Omar Castillo on 05/16/2019. The visit was conducted with the patient located at home and Omar Castillo at [facility]. The patient's identity was confirmed using their DOB and current address. The patient has consented to being evaluated through a telephone encounter and understands the associated risks (an examination cannot be done and the patient may need to come in for an appointment) / benefits (allows the patient to remain at home, decreasing exposure to coronavirus). I personally spent 9 minutes on medical discussion.  CC: Cough, Diabetes  HPI:  [name] is a 53 y.o. M with PMHx listed below presenting for Cough, Diabetes. Please see the A&P for the status of the patient's chronic medical problems.  Past Medical History:  Diagnosis Date  . Back pain, chronic   . Generalized anxiety disorder 07/25/2006  . Opioid use disorder, severe, on maintenance therapy ([place]) 05/23/2017  . 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 Cay Schillings   . Tobacco use 07/25/2006   Review of Systems:  Performed and all others negative.  Physical Exam:  There were no vitals filed for this visit. Not performed for tele-health visit  Assessment & Plan:   See Encounters Tab for problem based charting.  Patient discussed with Dr. Philipp Ovens

## 2019-05-16 NOTE — Assessment & Plan Note (Signed)
Discussed patient;s status by phone. He has continued to take his Lantus, Metformin, and Glipizide. He has also continued to check his blood sugars. He states his CBGs have rune from about 120 to 240 and he has only had a few measurements in the 200s. Will continue current regimen and have patient follow up for an in-person visit with A1c check in about 1 month. - Lantus 14U Daily - Metformin 1000mg  BID - Glipizide 5mg  BID - Follow up in 1 month, A1c at that time

## 2019-05-16 NOTE — Patient Instructions (Signed)
Verbal instruction given for tele-health visit. 

## 2019-05-23 ENCOUNTER — Other Ambulatory Visit: Payer: Self-pay | Admitting: Internal Medicine

## 2019-05-23 DIAGNOSIS — F1120 Opioid dependence, uncomplicated: Secondary | ICD-10-CM

## 2019-05-23 MED ORDER — BUPRENORPHINE HCL-NALOXONE HCL 8-2 MG SL SUBL: 1.0000 | SUBLINGUAL_TABLET | Freq: Two times a day (BID) | SUBLINGUAL | 1 refills | Status: DC

## 2019-05-23 MED FILL — BUPRENORPHIN-NALOXON 8-2 MG: 8-2 | 14 days supply | Qty: 28 | Fill #0

## 2019-05-23 NOTE — Telephone Encounter (Signed)
Refill Request  buprenorphine-naloxone (SUBOXONE) 8-2 mg SUBL SL tablet  [place], [place] - [address].

## 2019-05-23 NOTE — Telephone Encounter (Signed)
This medication can only be filled by physicians with a special DEA - in our practice that would be Dr. Daryll Drown, Dr, Evette Doffing or Dr. Heber [place]. I will forward it to them

## 2019-06-06 MED FILL — BUPRENORPHIN-NALOXON 8-2 MG: 8-2 | 14 days supply | Qty: 28 | Fill #1

## 2019-06-18 IMAGING — CT CT HEAD W/O CM
3 of 4 series · 15 of 47 positions shown, 18 images · non-contrast
Comparison: 03/09/2016

CLINICAL DATA: Unresponsive patient possible overdose

EXAM:
CT HEAD WITHOUT CONTRAST
CT CERVICAL SPINE WITHOUT CONTRAST
TECHNIQUE: Multidetector CT imaging of the head and cervical spine was
performed following the standard protocol without intravenous
contrast. Multiplanar CT image reconstructions of the cervical spine
were also generated.

[Series 9: orthogonal axial st · axial · 0.21mm/px · z∈[-299,-164]mm · 9 of 95 slices shown, 12 images]
[im 8/95  brain]
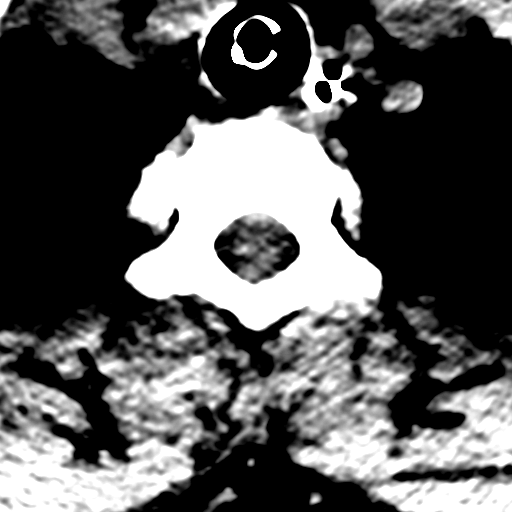
[im 8/95  bone]
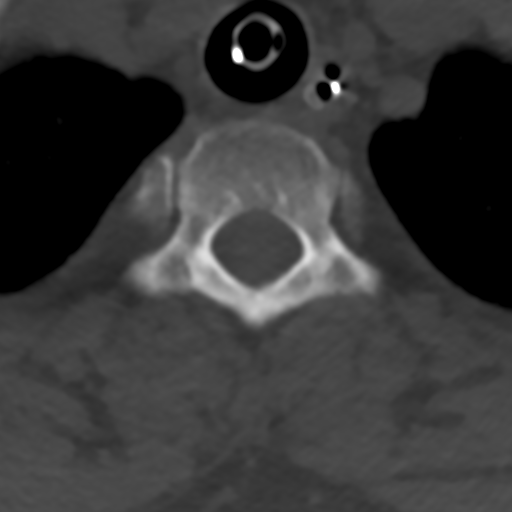
[im 22/95  brain]
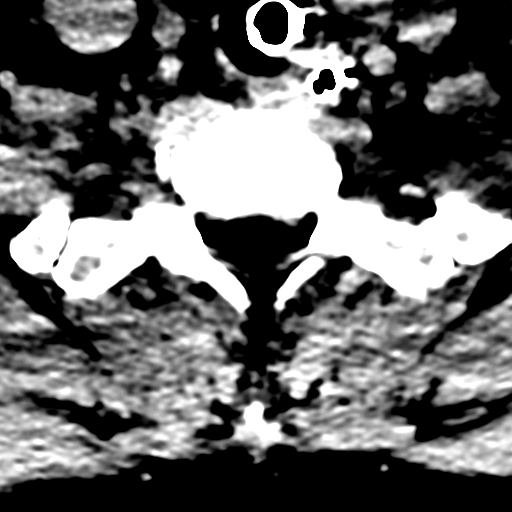
[im 29/95  brain]
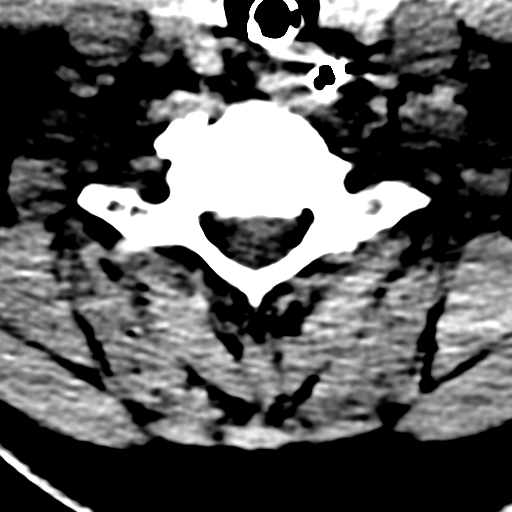
[im 37/95  brain]
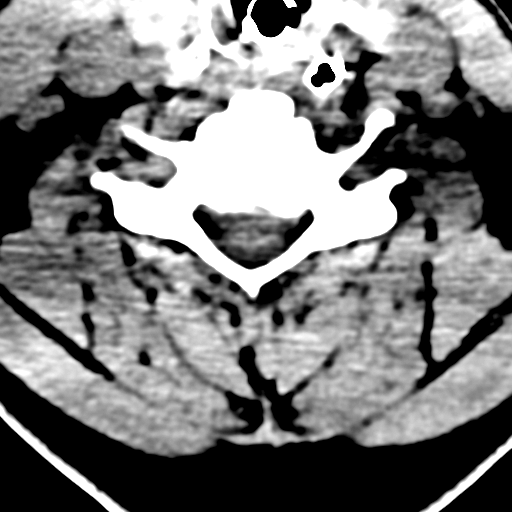
[im 51/95  brain]
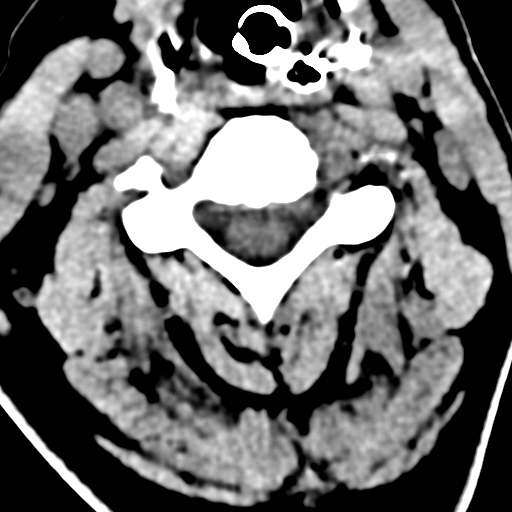
[im 51/95  bone]
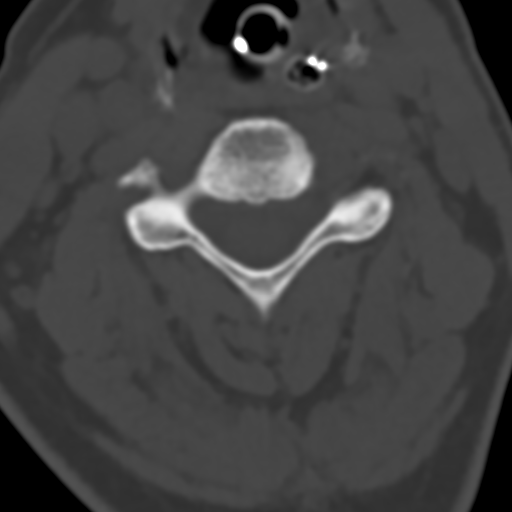
[im 58/95  brain]
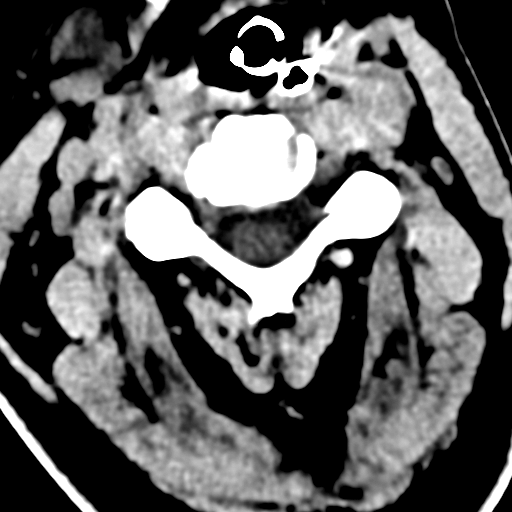
[im 66/95  brain]
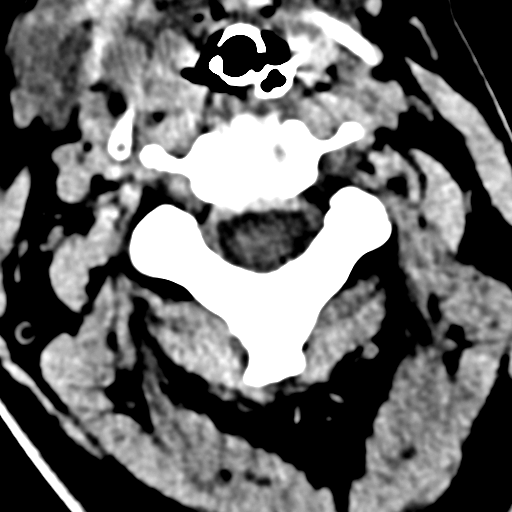
[im 80/95  brain]
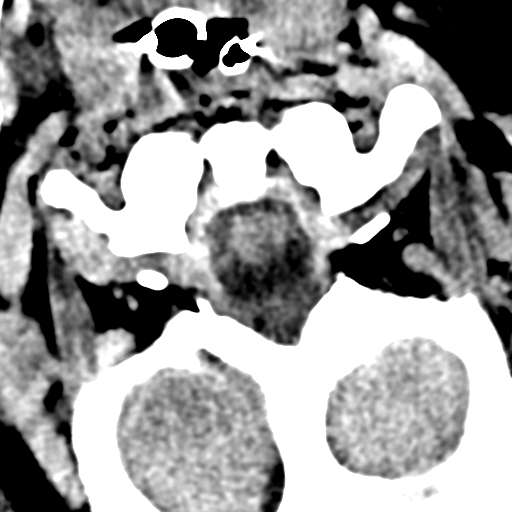
[im 87/95  brain]
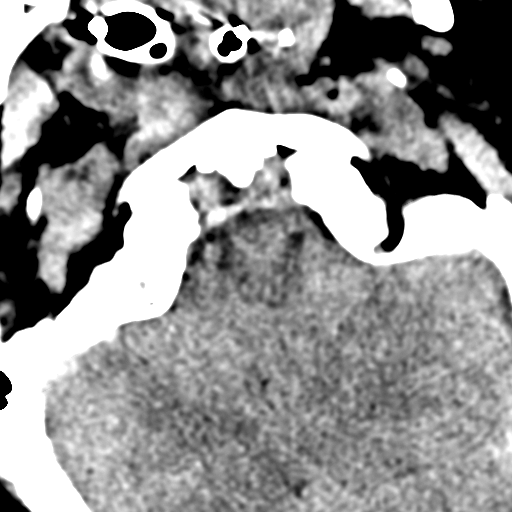
[im 87/95  bone]
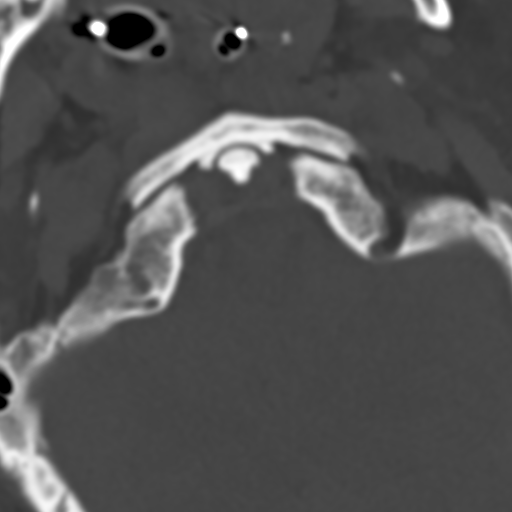

[Series 10: coronal bone · coronal · 0.35mm/px · 3 of 56 slices shown]
[im 19/56  brain]
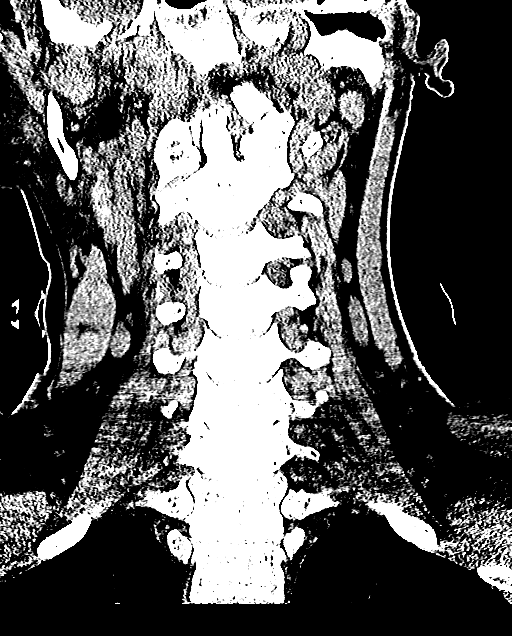
[im 25/56  brain]
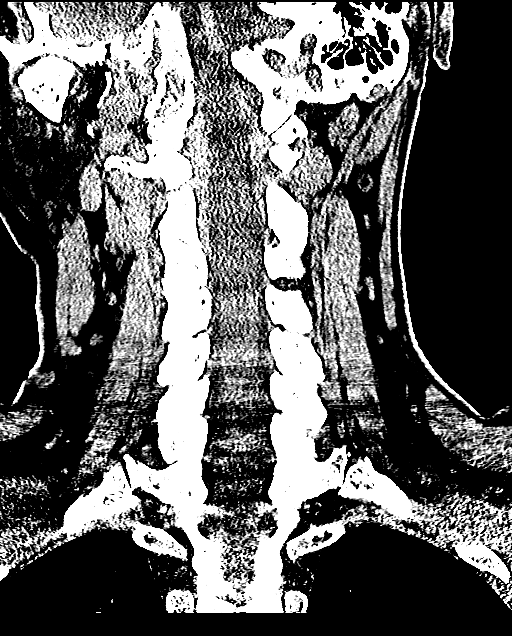
[im 31/56  brain]
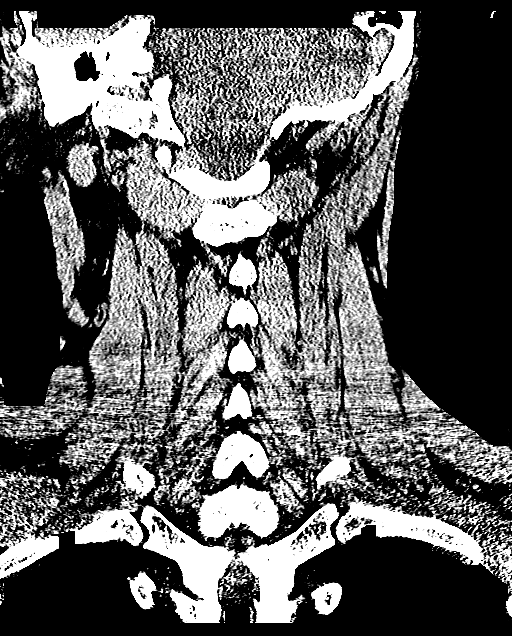

[Series 11: sagittal bone · sagittal · 0.35mm/px · 3 of 61 slices shown]
[im 21/61  brain]
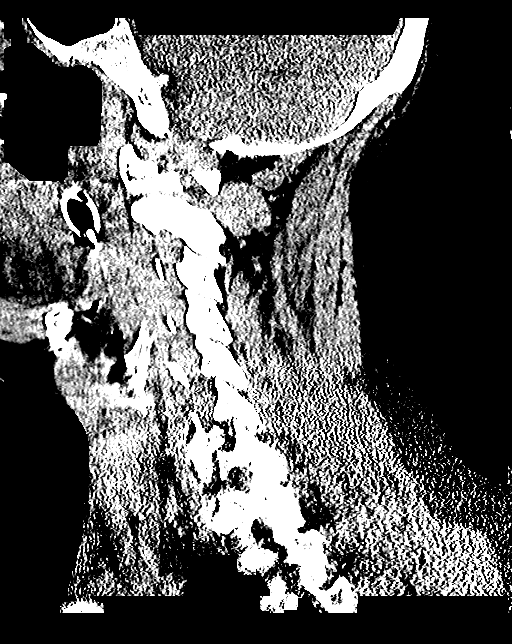
[im 31/61  brain]
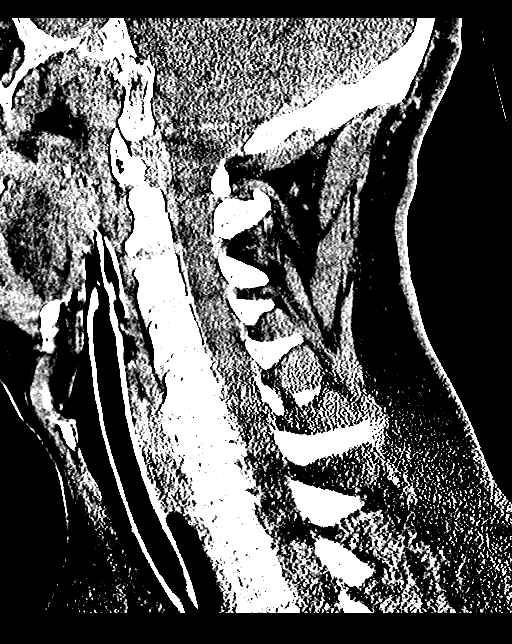
[im 41/61  brain]
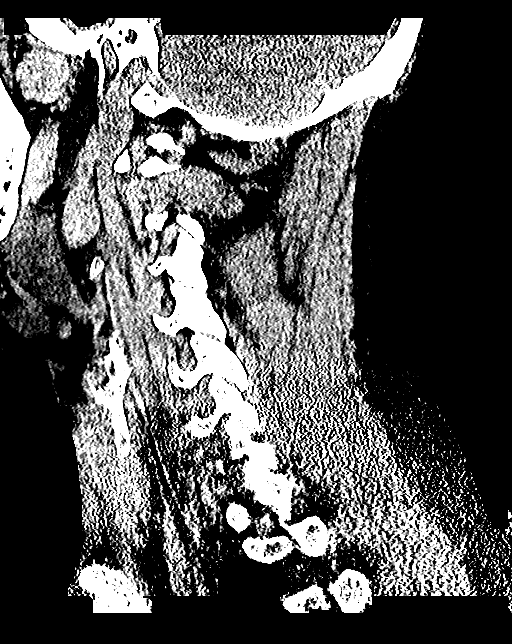

[15 of 47 positions shown; findings below may reference images not displayed]

FINDINGS: CT HEAD FINDINGS

Brain: No acute territorial infarction, hemorrhage or intracranial
mass is seen. The ventricles are nonenlarged. Mild cortical atrophy.

Vascular: No hyperdense vessels.  No unexpected calcification.

Skull: No fracture or suspicious focal lesion

Sinuses/Orbits: Fluid level in the left maxillary sinus with mucosal
thickening. Mucosal thickening in the ethmoid sinuses. No acute
orbital abnormality.

Other: None

CT CERVICAL SPINE FINDINGS

Alignment: Straightening of the cervical spine. No subluxation.
Facet alignment is within normal limits.

Skull base and vertebrae: No acute fracture. No primary bone lesion
or focal pathologic process.

Soft tissues and spinal canal: No prevertebral fluid or swelling. No
visible canal hematoma.

Disc levels: Moderate degenerative changes at C5-C6. Mild changes at
C4-C5 and C6-C7. Bilateral foraminal narrowing at C5-C6 and C6-C7.

Upper chest: Negative.

Other: None
IMPRESSION: 1. No CT evidence for acute intracranial abnormality.
2. Straightening of the cervical spine. No acute fracture or
malalignment
3. Sinusitis

## 2019-06-20 ENCOUNTER — Other Ambulatory Visit: Payer: Self-pay | Admitting: Internal Medicine

## 2019-06-20 DIAGNOSIS — F1120 Opioid dependence, uncomplicated: Secondary | ICD-10-CM

## 2019-06-20 MED ORDER — BUPRENORPHINE HCL-NALOXONE HCL 8-2 MG SL SUBL: 1.0000 | SUBLINGUAL_TABLET | Freq: Two times a day (BID) | SUBLINGUAL | 1 refills | Status: DC

## 2019-06-20 MED FILL — BUPRENORPHIN-NALOXON 8-2 MG: 8-2 | 14 days supply | Qty: 28 | Fill #0

## 2019-06-20 NOTE — Telephone Encounter (Signed)
Will refill.  He needs an appointment in the upcoming OUD clinics.

## 2019-06-20 NOTE — Telephone Encounter (Signed)
Needs refill on buprenorphine-naloxone (SUBOXONE) 8-2 mg SUBL SL tablet  ;pt contact [place], [place] - [address] [facility]

## 2019-06-21 NOTE — Telephone Encounter (Signed)
Attempted to reach patient numerous times, pt did not answer and unable to LVM. Please advised.

## 2019-06-25 NOTE — Telephone Encounter (Signed)
Pt has an appt schedule for this morning.

## 2019-07-08 MED FILL — BUPRENORPHIN-NALOXON 8-2 MG: 8-2 | 14 days supply | Qty: 28 | Fill #1

## 2019-07-31 ENCOUNTER — Other Ambulatory Visit: Payer: Self-pay

## 2019-07-31 DIAGNOSIS — F1120 Opioid dependence, uncomplicated: Secondary | ICD-10-CM

## 2019-07-31 MED ORDER — BUPRENORPHINE HCL-NALOXONE HCL 8-2 MG SL SUBL: 1.0000 | SUBLINGUAL_TABLET | Freq: Two times a day (BID) | SUBLINGUAL | 0 refills | Status: DC

## 2019-07-31 MED FILL — BUPRENORPHIN-NALOXON 8-2 MG: 8-2 | 14 days supply | Qty: 28 | Fill #0

## 2019-07-31 NOTE — Telephone Encounter (Addendum)
Left detailed message on patient's self-identified VM with this information and requested return call to schedule in OUD. Hubbard Hartshorn, BSN, RN-BC

## 2019-07-31 NOTE — Telephone Encounter (Signed)
buprenorphine-naloxone (SUBOXONE) 8-2 mg SUBL SL tablet   REFILL REQUEST @  [facility] - [place], [place] - [address] [phone] (Phone) [phone] (Fax)    

## 2019-07-31 NOTE — Telephone Encounter (Signed)
I will approve a 2 week supply. But he must be seen in [place] clinic within the next 2 weeks to continue from there.

## 2019-08-19 ENCOUNTER — Other Ambulatory Visit: Payer: Self-pay | Admitting: Internal Medicine

## 2019-08-19 DIAGNOSIS — F1120 Opioid dependence, uncomplicated: Secondary | ICD-10-CM

## 2019-08-19 MED ORDER — BUPRENORPHINE HCL-NALOXONE HCL 8-2 MG SL SUBL: 1.0000 | SUBLINGUAL_TABLET | Freq: Two times a day (BID) | SUBLINGUAL | 0 refills | Status: AC

## 2019-08-19 MED FILL — BUPRENORPHIN-NALOXON 8-2 MG: 8-2 | 14 days supply | Qty: 28 | Fill #0

## 2019-08-19 NOTE — Telephone Encounter (Signed)
Given the date of his appointment, I sent in a refill for his buprenorphine-naloxone.   Please let Mr. College know that if he does not present for his appointment, he will no longer receive refills and will need to seek alternative care.  We can provide him with phone numbers of other providers in the community.   Thanks!  Gilles Chiquito, MD

## 2019-08-19 NOTE — Telephone Encounter (Signed)
Rx refused.  He needs to call for an appointment.  Last seen in August.

## 2019-08-19 NOTE — Telephone Encounter (Signed)
Please schedule pt an appointment per Dr Daryll Drown. Thanks

## 2019-08-19 NOTE — Telephone Encounter (Signed)
Needs refill on buprenorphine-naloxone (SUBOXONE) 8-2 mg SUBL SL tablet , pt contact [place], [place] - [address] [facility].

## 2019-08-19 NOTE — Addendum Note (Signed)
Addended by: Gilles Chiquito B on: 08/19/2019 04:34 PM   Modules accepted: Orders

## 2019-08-20 NOTE — Telephone Encounter (Signed)
Pt called / informed of suboxone refill and to keep his appt on 12/15 or he will no longer receive refills and will "need to seek alternative care" per Dr Daryll Drown. Verbalized understanding "I will be there".

## 2019-08-27 ENCOUNTER — Ambulatory Visit: Payer: Self-pay

## 2019-08-27 ENCOUNTER — Telehealth: Payer: Self-pay | Admitting: *Deleted

## 2019-08-27 NOTE — Telephone Encounter (Signed)
Patient no showed OUD appt today. No answer when Provider attempted to reach him. Last A1c > 14. Please assist patient in scheduling ACC appt as soon as possible. Thanks!

## 2019-08-28 ENCOUNTER — Encounter: Payer: Self-pay | Admitting: Internal Medicine

## 2019-09-05 ENCOUNTER — Ambulatory Visit: Payer: HRSA Program | Attending: Internal Medicine

## 2019-09-05 DIAGNOSIS — Z20828 Contact with and (suspected) exposure to other viral communicable diseases: Secondary | ICD-10-CM | POA: Diagnosis not present

## 2019-09-05 DIAGNOSIS — Z20822 Contact with and (suspected) exposure to covid-19: Secondary | ICD-10-CM

## 2019-09-06 LAB — NOVEL CORONAVIRUS, NAA: SARS-CoV-2, NAA: NOT DETECTED

## 2020-05-06 ENCOUNTER — Encounter: Payer: Self-pay | Admitting: Internal Medicine

## 2020-05-11 ENCOUNTER — Encounter: Payer: Self-pay | Admitting: Internal Medicine

## 2020-05-11 ENCOUNTER — Other Ambulatory Visit: Payer: Self-pay

## 2020-05-11 ENCOUNTER — Telehealth: Payer: Self-pay | Admitting: *Deleted

## 2020-05-11 NOTE — Telephone Encounter (Signed)
Pt did not come to his appt today. Called pt - stated he did not know his appt was today. Re-scheduled 9/13 @ 0945 AM.

## 2020-05-21 NOTE — Progress Notes (Deleted)
   Acute Office Visit   Patient ID: Omar Castillo, male    DOB: 11-03-1965, 54 y.o.   MRN: [id number]  Subjective:  CC: diabetes follow up  HPI 54 y.o. presents today for follow up of poorly controlled T2DM. Review of dispense history indicates that medications have not been recently filled.   DIABETES TYPE 2 FOLLOW-UP: Anti-hyperglycemic agents: {Plan; diabetes:14079} Secondary agents:{Plan; diabetes:14079} Last A1C: >14 A1C today: Have you taken medication(s) today: *** Med Adherence:  []  Yes    []  No Medication side effects:  []  Yes    []  No  Home Monitoring?  []  Yes    []  No Home glucose results range: *** Diet Adherence: []  Yes    []  No Exercise: []  Yes    []  No Hypoglycemic episodes?: *** Numbness of the feet? []  Yes    []  No Retinopathy hx? []  Yes    []  No Last eye exam: *** Last foot exam negative within the past year. Comments: ***        ACTIVE MEDICATIONS   Current Outpatient Medications on File Prior to Visit  Medication Sig Dispense Refill  . buprenorphine-naloxone (SUBOXONE) 8-2 mg SUBL SL tablet Place 1 tablet under the tongue 2 (two) times daily. 28 tablet 0  . busPIRone (BUSPAR) 15 MG tablet Take 1 tablet (15 mg total) by mouth 3 (three) times daily. 90 tablet 2  . FLUoxetine (PROZAC) 20 MG capsule Take 1 capsule (20 mg total) by mouth daily. 90 capsule 3  . glipiZIDE (GLUCOTROL) 5 MG tablet Take 1 tablet (5 mg total) by mouth 2 (two) times daily before a meal. 180 tablet 2  . Insulin Glargine (LANTUS SOLOSTAR) 100 UNIT/ML Solostar Pen Inject 14 Units into the skin daily.    lisinopril (PRINIVIL,ZESTRIL) 40 MG tablet Take 1 tablet (40 mg total) by mouth daily. 90 tablet 3  . metFORMIN (GLUCOPHAGE) 1000 MG tablet Take 1 tablet (1,000 mg total) by mouth 2 (two) times daily with a meal. 180 tablet 3   No current facility-administered medications on file prior to visit.    ROS  Review of Systems  Objective:   There were no vitals taken for this  visit. Wt Readings from Last 3 Encounters:  03/05/19 156 lb 6.4 oz (70.9 kg)  11/13/18 173 lb 3.2 oz (78.6 kg)  10/23/18 174 lb 6.4 oz (79.1 kg)   BP Readings from Last 3 Encounters:  03/05/19 129/85  11/13/18 (!) 144/90  10/23/18 (!) 151/84   Physical Exam  Assessment & Plan:   Problem List Items Addressed This Visit      Cardiovascular and Mediastinum   Hypertension (Chronic)    **        Endocrine   Uncontrolled type 2 diabetes mellitus with hyperglycemia, without long-term current use of insulin (HCC) (Chronic)    **        Other   Tobacco use (Chronic)    **      Opioid use disorder, severe, on maintenance therapy (HCC) (Chronic)    **           Pt discussed with ***  , MD Internal Medicine Resident PGY-2 Internal Medicine Residency Pager: [phone] 05/21/2020 8:34 AM

## 2020-05-25 ENCOUNTER — Encounter: Payer: Self-pay | Admitting: Internal Medicine

## 2021-12-08 ENCOUNTER — Ambulatory Visit
Admission: RE | Admit: 2021-12-08 | Discharge: 2021-12-08 | Disposition: A | Discharge status: Home / Self Care | Payer: Self-pay | Source: Ambulatory Visit | Attending: Pain Medicine | Admitting: Pain Medicine

## 2021-12-08 ENCOUNTER — Other Ambulatory Visit: Payer: Self-pay | Admitting: Pain Medicine

## 2021-12-08 DIAGNOSIS — M961 Postlaminectomy syndrome, not elsewhere classified: Secondary | ICD-10-CM

## 2021-12-29 ENCOUNTER — Other Ambulatory Visit: Payer: Self-pay | Admitting: Pain Medicine

## 2021-12-29 DIAGNOSIS — M5116 Intervertebral disc disorders with radiculopathy, lumbar region: Secondary | ICD-10-CM

## 2022-01-09 ENCOUNTER — Other Ambulatory Visit: Payer: Medicaid Other

## 2022-01-13 ENCOUNTER — Other Ambulatory Visit: Payer: Medicaid Other

## 2022-01-18 ENCOUNTER — Ambulatory Visit
Admission: RE | Admit: 2022-01-18 | Discharge: 2022-01-18 | Disposition: A | Discharge status: Home / Self Care | Payer: Medicaid Other | Source: Ambulatory Visit | Attending: Pain Medicine | Admitting: Pain Medicine

## 2022-01-18 DIAGNOSIS — M5116 Intervertebral disc disorders with radiculopathy, lumbar region: Secondary | ICD-10-CM

## 2022-09-28 ENCOUNTER — Other Ambulatory Visit: Payer: Self-pay | Admitting: Internal Medicine

## 2022-09-28 DIAGNOSIS — F17200 Nicotine dependence, unspecified, uncomplicated: Secondary | ICD-10-CM

## 2022-10-24 ENCOUNTER — Inpatient Hospital Stay: Admission: RE | Admit: 2022-10-24 | Payer: Medicaid Other | Source: Ambulatory Visit

## 2022-11-30 ENCOUNTER — Ambulatory Visit
Admission: RE | Admit: 2022-11-30 | Discharge: 2022-11-30 | Disposition: A | Discharge status: Home / Self Care | Payer: Medicaid Other | Source: Ambulatory Visit | Attending: Internal Medicine | Admitting: Internal Medicine

## 2022-11-30 DIAGNOSIS — F17200 Nicotine dependence, unspecified, uncomplicated: Secondary | ICD-10-CM
